# Patient Record
Sex: Female | Born: 1935 | Race: White | Hispanic: No | Smoking: Never smoker
Health system: Southern US, Community
[De-identification: ages and names within clinical notes are randomized; demographics above are authoritative.]

## PROBLEM LIST (undated history)

## (undated) DIAGNOSIS — E7800 Pure hypercholesterolemia, unspecified: Secondary | ICD-10-CM

## (undated) DIAGNOSIS — F329 Major depressive disorder, single episode, unspecified: Secondary | ICD-10-CM

## (undated) DIAGNOSIS — Z952 Presence of prosthetic heart valve: Secondary | ICD-10-CM

## (undated) DIAGNOSIS — F32A Depression, unspecified: Secondary | ICD-10-CM

## (undated) DIAGNOSIS — K219 Gastro-esophageal reflux disease without esophagitis: Secondary | ICD-10-CM

## (undated) DIAGNOSIS — I10 Essential (primary) hypertension: Secondary | ICD-10-CM

## (undated) DIAGNOSIS — I219 Acute myocardial infarction, unspecified: Secondary | ICD-10-CM

## (undated) HISTORY — PX: CARDIAC VALVE REPLACEMENT: SHX585

---

## 2004-06-25 ENCOUNTER — Ambulatory Visit: Payer: Self-pay | Admitting: Internal Medicine

## 2004-10-16 ENCOUNTER — Ambulatory Visit: Payer: Self-pay | Admitting: Gastroenterology

## 2004-10-26 ENCOUNTER — Ambulatory Visit: Payer: Self-pay | Admitting: Unknown Physician Specialty

## 2005-04-13 ENCOUNTER — Ambulatory Visit: Payer: Self-pay | Admitting: Gastroenterology

## 2005-07-08 ENCOUNTER — Ambulatory Visit: Payer: Self-pay | Admitting: Internal Medicine

## 2005-09-08 ENCOUNTER — Ambulatory Visit: Payer: Self-pay | Admitting: Internal Medicine

## 2005-09-19 ENCOUNTER — Ambulatory Visit: Payer: Self-pay | Admitting: Internal Medicine

## 2005-10-20 ENCOUNTER — Ambulatory Visit: Payer: Self-pay | Admitting: Internal Medicine

## 2006-12-29 ENCOUNTER — Ambulatory Visit: Payer: Self-pay | Admitting: Family Medicine

## 2007-06-07 ENCOUNTER — Ambulatory Visit: Payer: Self-pay | Admitting: Internal Medicine

## 2007-09-23 ENCOUNTER — Ambulatory Visit: Payer: Self-pay | Admitting: Family Medicine

## 2008-01-29 ENCOUNTER — Ambulatory Visit: Payer: Self-pay | Admitting: Family Medicine

## 2009-02-04 ENCOUNTER — Ambulatory Visit: Payer: Self-pay | Admitting: Nurse Practitioner

## 2009-04-08 ENCOUNTER — Encounter: Payer: Self-pay | Admitting: Nurse Practitioner

## 2009-05-04 ENCOUNTER — Ambulatory Visit: Payer: Self-pay | Admitting: Family Medicine

## 2010-04-16 ENCOUNTER — Ambulatory Visit: Payer: Self-pay | Admitting: Gastroenterology

## 2010-04-30 ENCOUNTER — Ambulatory Visit: Payer: Self-pay | Admitting: Family Medicine

## 2010-12-18 ENCOUNTER — Ambulatory Visit: Payer: Self-pay | Admitting: Internal Medicine

## 2010-12-28 ENCOUNTER — Ambulatory Visit: Payer: Self-pay | Admitting: Internal Medicine

## 2011-01-08 ENCOUNTER — Ambulatory Visit: Payer: Self-pay | Admitting: Internal Medicine

## 2011-04-21 ENCOUNTER — Encounter: Payer: Self-pay | Admitting: Internal Medicine

## 2011-04-23 ENCOUNTER — Encounter: Payer: Self-pay | Admitting: Internal Medicine

## 2011-05-21 ENCOUNTER — Encounter: Payer: Self-pay | Admitting: Internal Medicine

## 2011-06-21 ENCOUNTER — Encounter: Payer: Self-pay | Admitting: Internal Medicine

## 2011-07-21 ENCOUNTER — Encounter: Payer: Self-pay | Admitting: Internal Medicine

## 2011-09-29 ENCOUNTER — Ambulatory Visit: Payer: Self-pay | Admitting: Family Medicine

## 2012-03-01 ENCOUNTER — Observation Stay: Payer: Self-pay | Admitting: Internal Medicine

## 2012-03-01 LAB — BASIC METABOLIC PANEL
BUN: 16 mg/dL (ref 7–18)
Calcium, Total: 8.4 mg/dL — ABNORMAL LOW (ref 8.5–10.1)
Chloride: 108 mmol/L — ABNORMAL HIGH (ref 98–107)
Co2: 25 mmol/L (ref 21–32)
Creatinine: 0.62 mg/dL (ref 0.60–1.30)
EGFR (Non-African Amer.): >60
Glucose: 118 mg/dL — ABNORMAL HIGH (ref 65–99)
Osmolality: 282 (ref 275–301)
Potassium: 4.8 mmol/L (ref 3.5–5.1)
Sodium: 140 mmol/L (ref 136–145)

## 2012-03-01 LAB — CBC
HGB: 12.7 g/dL (ref 12.0–16.0)
MCH: 30.9 pg (ref 26.0–34.0)
MCHC: 32.8 g/dL (ref 32.0–36.0)
MCV: 94 fL (ref 80–100)
RBC: 4.11 10*6/uL (ref 3.80–5.20)

## 2012-03-02 LAB — BASIC METABOLIC PANEL
Anion Gap: 5 — ABNORMAL LOW (ref 7–16)
BUN: 17 mg/dL (ref 7–18)
Chloride: 107 mmol/L (ref 98–107)
Co2: 28 mmol/L (ref 21–32)
Creatinine: 0.79 mg/dL (ref 0.60–1.30)
EGFR (African American): >60
EGFR (Non-African Amer.): >60
Glucose: 124 mg/dL — ABNORMAL HIGH (ref 65–99)
Osmolality: 282 (ref 275–301)

## 2012-03-02 LAB — CBC WITH DIFFERENTIAL/PLATELET
Basophil #: 0 10*3/uL (ref 0.0–0.1)
Eosinophil %: 1 %
HCT: 32.7 % — ABNORMAL LOW (ref 35.0–47.0)
HGB: 10.7 g/dL — ABNORMAL LOW (ref 12.0–16.0)
Lymphocyte #: 1 10*3/uL (ref 1.0–3.6)
Lymphocyte %: 11.2 %
MCH: 30.7 pg (ref 26.0–34.0)
MCV: 94 fL (ref 80–100)
Monocyte %: 10.3 %
Neutrophil #: 7.2 10*3/uL — ABNORMAL HIGH (ref 1.4–6.5)
Platelet: 174 10*3/uL (ref 150–440)
RBC: 3.5 10*6/uL — ABNORMAL LOW (ref 3.80–5.20)
RDW: 14 % (ref 11.5–14.5)
WBC: 9.4 10*3/uL (ref 3.6–11.0)

## 2012-03-03 ENCOUNTER — Encounter: Payer: Self-pay | Admitting: Internal Medicine

## 2012-03-03 LAB — CBC WITH DIFFERENTIAL/PLATELET
Basophil %: 0.5 %
Eosinophil %: 1.9 %
HCT: 33.3 % — ABNORMAL LOW (ref 35.0–47.0)
HGB: 11.8 g/dL — ABNORMAL LOW (ref 12.0–16.0)
Lymphocyte #: 1.1 10*3/uL (ref 1.0–3.6)
Lymphocyte %: 9 %
MCHC: 35.5 g/dL (ref 32.0–36.0)
MCV: 94 fL (ref 80–100)
Monocyte %: 9.7 %
Neutrophil #: 9.2 10*3/uL — ABNORMAL HIGH (ref 1.4–6.5)
WBC: 11.7 10*3/uL — ABNORMAL HIGH (ref 3.6–11.0)

## 2012-03-03 LAB — BASIC METABOLIC PANEL
BUN: 14 mg/dL (ref 7–18)
Calcium, Total: 8.6 mg/dL (ref 8.5–10.1)
Creatinine: 0.77 mg/dL (ref 0.60–1.30)
EGFR (African American): >60
EGFR (Non-African Amer.): >60
Glucose: 124 mg/dL — ABNORMAL HIGH (ref 65–99)
Osmolality: 279 (ref 275–301)
Potassium: 4.1 mmol/L (ref 3.5–5.1)
Sodium: 139 mmol/L (ref 136–145)

## 2012-03-22 ENCOUNTER — Encounter: Payer: Self-pay | Admitting: Internal Medicine

## 2012-04-03 ENCOUNTER — Ambulatory Visit: Payer: Self-pay | Admitting: Orthopedic Surgery

## 2012-04-06 LAB — BASIC METABOLIC PANEL
Anion Gap: 4 — ABNORMAL LOW (ref 7–16)
BUN: 16 mg/dL (ref 7–18)
Co2: 29 mmol/L (ref 21–32)
Creatinine: 0.7 mg/dL (ref 0.60–1.30)
EGFR (Non-African Amer.): >60
Potassium: 4.3 mmol/L (ref 3.5–5.1)
Sodium: 140 mmol/L (ref 136–145)

## 2012-05-10 ENCOUNTER — Ambulatory Visit: Payer: Self-pay | Admitting: Family Medicine

## 2013-11-19 IMAGING — CR DG KNEE COMPLETE 4+V*L*
1 series · 4 of 4 positions shown · non-contrast
Comparison: none

REASON FOR EXAM: left knee pain, injury
COMMENTS:

PROCEDURE:     DXR - DXR KNEE LT COMP WITH OBLIQUES  - March 01, 2012  [DATE]
RESULT:     Degenerative change. No acute bony or joint abnormality.

[Series 1: ap · 0.17mm/px · 4 of 4 slices shown]
[im 1/4]
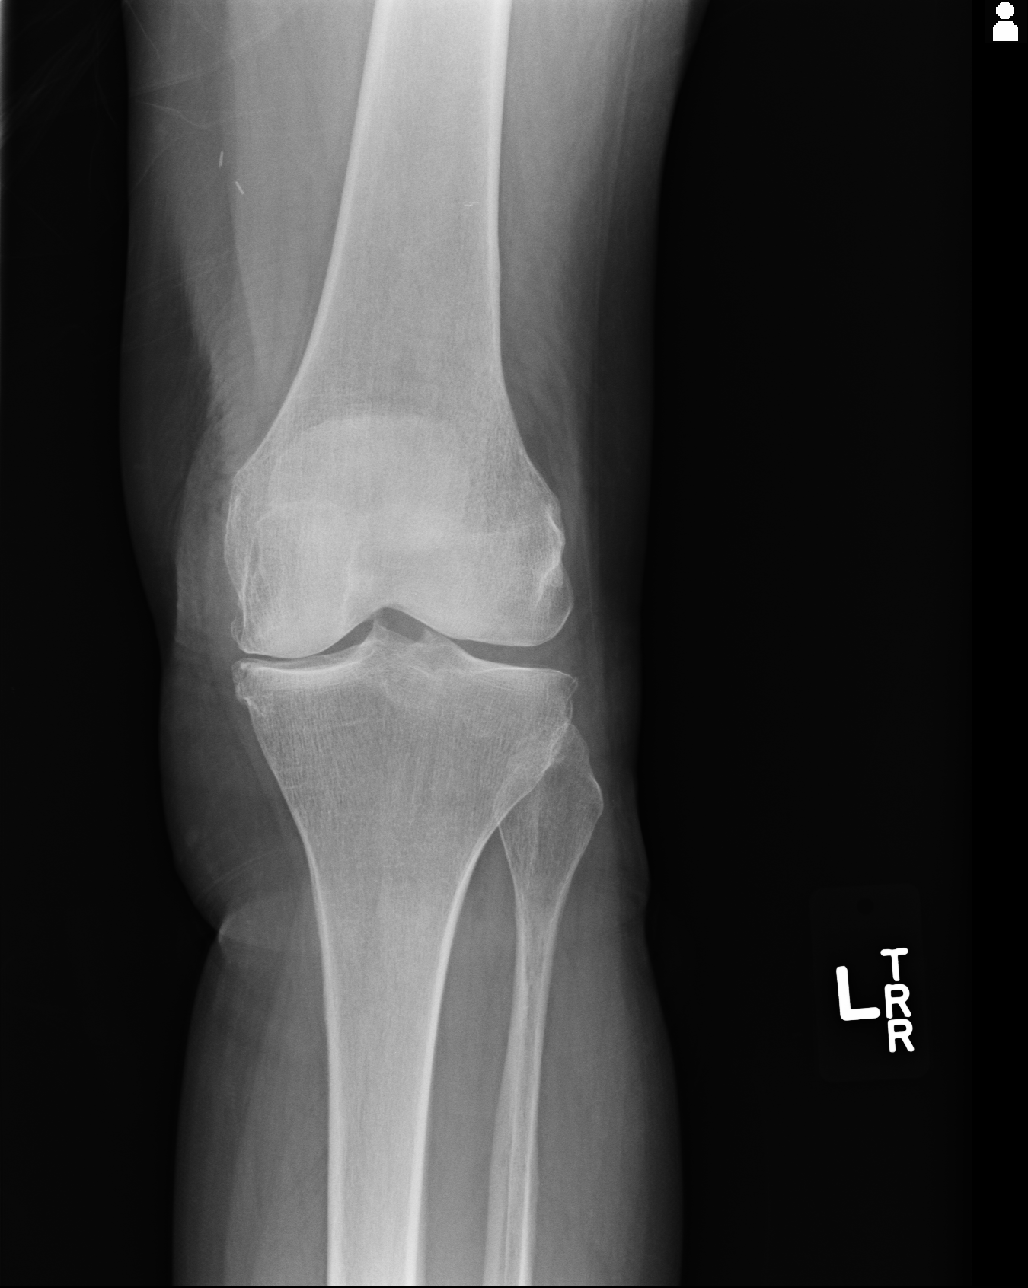
[im 2/4]
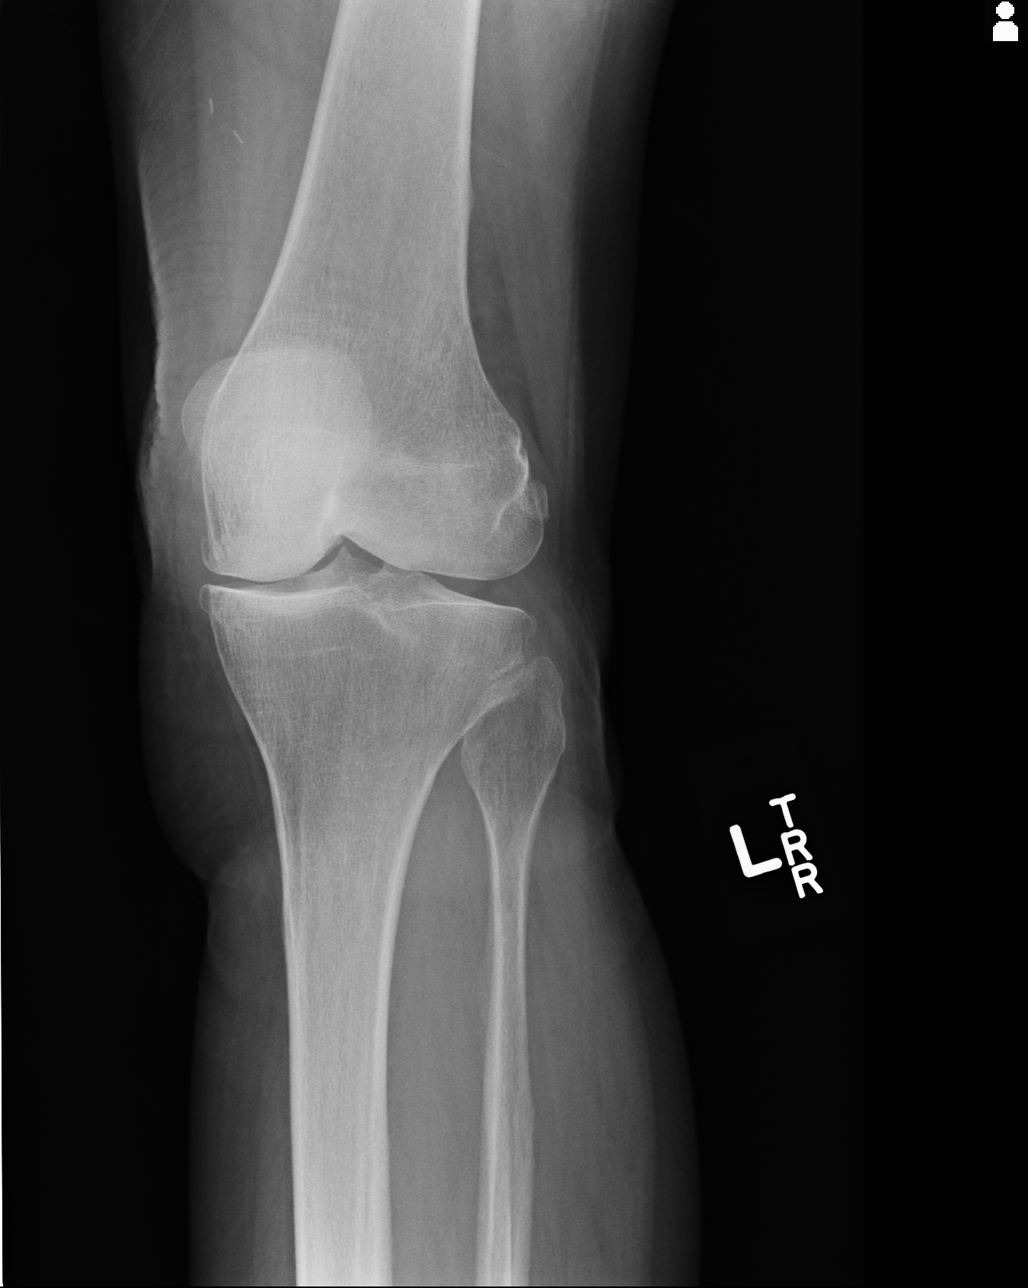
[im 3/4]
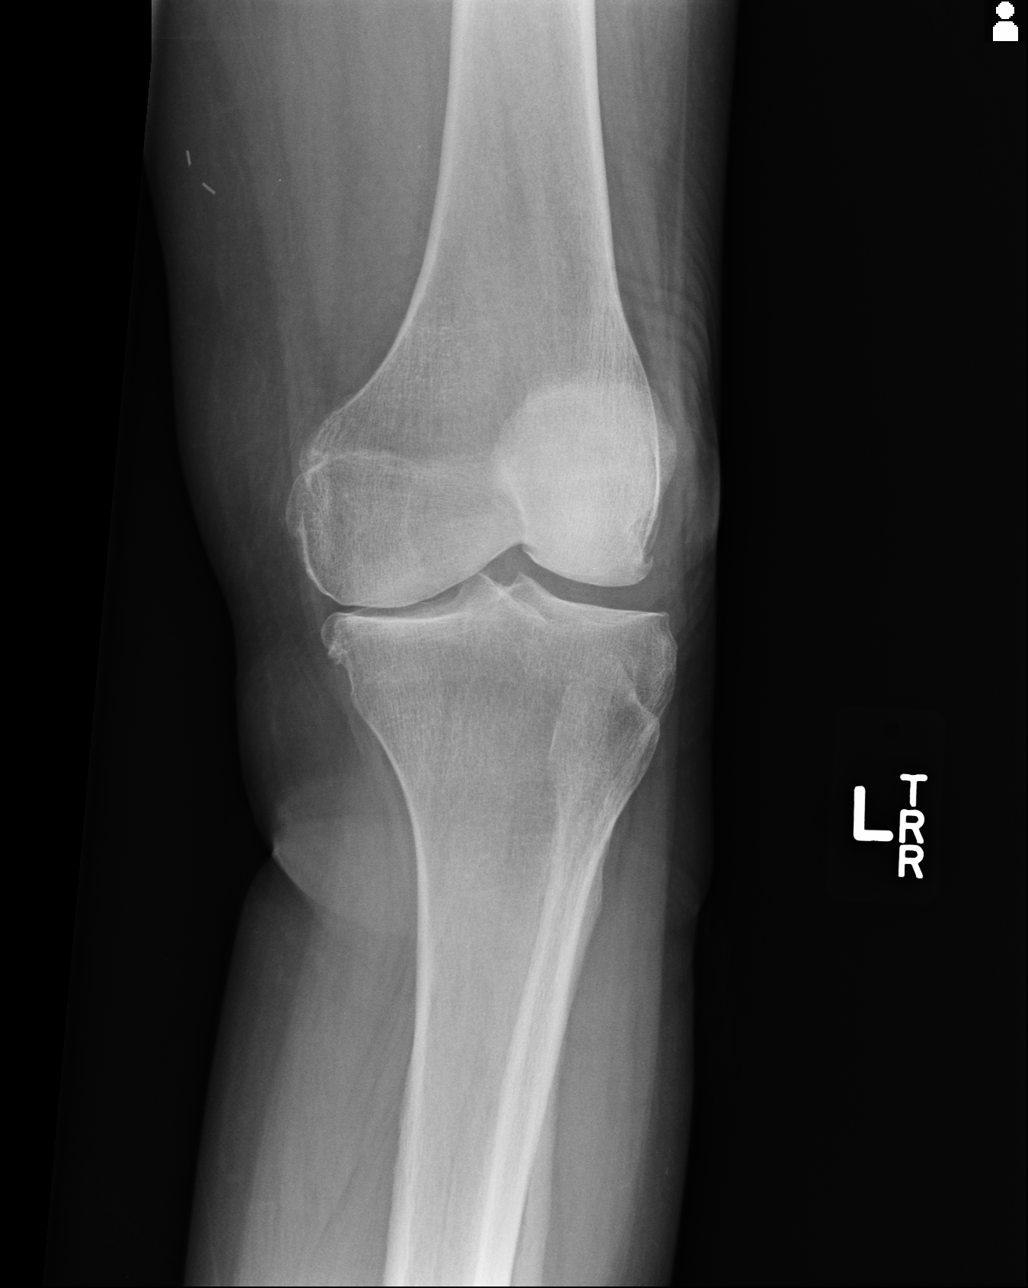
[im 4/4]
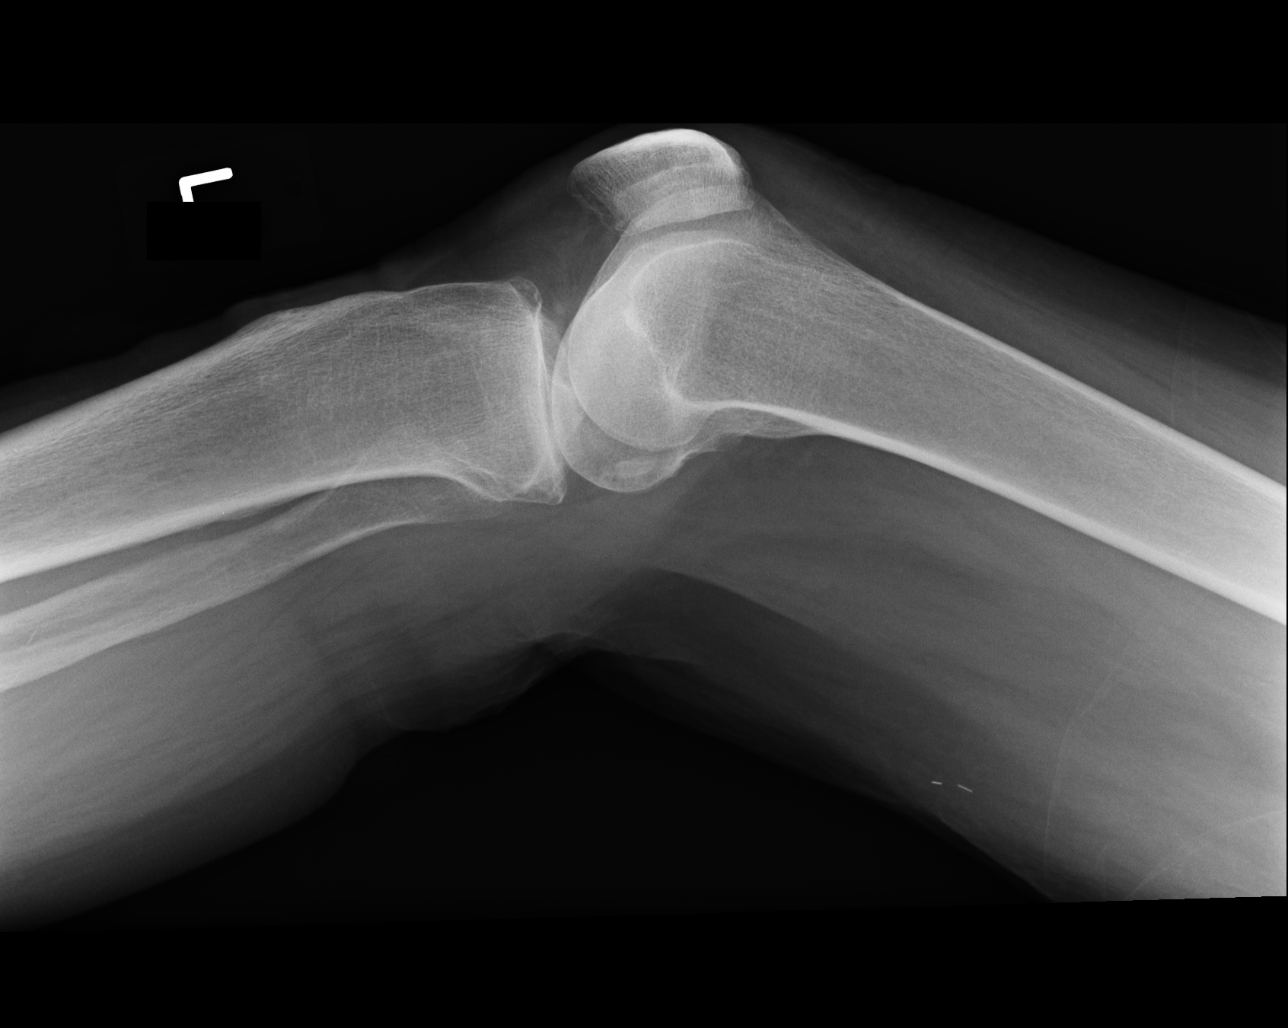

[4 of 4 positions shown; findings below may reference images not displayed]

IMPRESSION: Degenerative change. No acute bony or joint abnormality
noted. Incidental note is made of surgical clips over the medial thigh.

## 2013-12-07 DIAGNOSIS — E785 Hyperlipidemia, unspecified: Secondary | ICD-10-CM | POA: Insufficient documentation

## 2013-12-07 DIAGNOSIS — I2510 Atherosclerotic heart disease of native coronary artery without angina pectoris: Secondary | ICD-10-CM | POA: Insufficient documentation

## 2013-12-07 DIAGNOSIS — I10 Essential (primary) hypertension: Secondary | ICD-10-CM | POA: Insufficient documentation

## 2013-12-07 DIAGNOSIS — R059 Cough, unspecified: Secondary | ICD-10-CM | POA: Insufficient documentation

## 2013-12-08 DIAGNOSIS — I5023 Acute on chronic systolic (congestive) heart failure: Secondary | ICD-10-CM | POA: Insufficient documentation

## 2014-07-09 NOTE — H&P (Signed)
PATIENT NAME:  Tina Mclaughlin, TRAMPE MR#:  [id number] DATE OF BIRTH:  [birth year]  DATE OF ADMISSION:  03/01/2012  PRIMARY CARE PHYSICIAN: Dr. Quillian Quince    REFERRING PHYSICIAN: Dr. Darnelle Catalan   CHIEF COMPLAINT: Fall by accident today.   HISTORY OF PRESENT ILLNESS: The patient is a 81 year old Caucasian female with a history of CAD, hypertension, and hyperlipidemia who presented to the ED with fall by accident today. The patient is alert, awake, oriented in no acute distress. She said she fell by accident at home today and has pelvic pain and left knee pain. She injured her left knee with some bruises but she denies any loss of consciousness. No syncope, seizure, or incontinence. No slurred speech or dysphagia. The patient is living alone. Dr. Darnelle Catalan and I discussed with the case manager. Case manager recommend the patient needed to be placed for observation, get a physical therapy evaluation, and pain control.   PAST MEDICAL HISTORY: As mentioned above:  1. CAD.  2. Hypertension.  3. Hyperlipidemia.   PAST SURGICAL HISTORY:  1. CABG.  2. Shoulder surgery.   SOCIAL HISTORY: The patient is living alone. No smoking, alcohol drinking, or illicit drugs.   FAMILY HISTORY: CAD, congestive heart failure.   ALLERGIES: Fosamax.   HOME MEDICATIONS:  1. Zocor 20 mg p.o. daily.  2. Promethazine 25 mg every six hours.  3. Lopressor 25 mg p.o. at bedtime.  4. Diclofenac potassium 50 mg p.o. powder for reconstitution.  5. Aspirin 81 mg p.o. daily.  6. Alendronate 70 mg p.o. once a week.   REVIEW OF SYSTEMS: CONSTITUTIONAL: The patient denies any fever or chills. No headache or dizziness. No weakness. EYES: No double vision or blurred vision. ENT: No postnasal drip, slurred speech, or dysphagia. CARDIOVASCULAR: No chest pain, palpitation, orthopnea, or nocturnal dyspnea. No leg edema. PULMONARY: No cough, sputum, shortness of breath, or hemoptysis. GI: No abdominal pain, nausea, vomiting, or diarrhea. No  melena or bloody stool. GU: No dysuria, hematuria, or incontinence. SKIN: No rash or jaundice. NEUROLOGY: No syncope, loss of consciousness, or seizure. MUSCULOSKELETAL: Pelvic pain and left knee pain.   PHYSICAL EXAMINATION:   VITAL SIGNS: Temperature 98.2, blood pressure 169/78, pulse 66, oxygen saturation 98% on room air.   GENERAL: The patient is alert, awake, oriented in no acute distress.   HEENT: Pupils round, equal, reactive to light and accommodation. Dry oral mucosa. Clear oropharynx.   NECK: Supple. No JVD or carotid bruit. No lymphadenopathy. No thyromegaly.   CARDIOVASCULAR: S1, S2 regular rate and rhythm. No murmurs or gallops.   PULMONARY: Bilateral air entry. No wheezing or rales. No use of accessory muscles to breathe.   ABDOMEN: Soft. No distention. No tenderness. No organomegaly. Bowel sounds present.   EXTREMITIES: No edema, clubbing, or cyanosis. No calf tenderness. Strong bilateral pedal pulses. Bruises and skin tear on the left knee. The patient cannot walk or move his left leg due to pain.   NEUROLOGY: Alert and oriented x3. No focal deficit.   LABORATORY DATA: No laboratory data.   RADIOLOGY: Chest x-ray showed pelvic fracture.   IMPRESSION:  1. Pelvic fracture due to fall.  2. CAD.  3. Hypertension.  4. Hyperlipidemia.   PLAN OF TREATMENT:  1. The patient will be placed for observation.  2. We will have pain control with Percocet and morphine p.r.n.  3. Will get a PT evaluation.  4. Will continue aspirin and Lopressor.  5. GI and DVT prophylaxis.   Discussed the patient's situation and  plan of treatment with the patient and the case manager.   TIME SPENT: About 50 minutes.   ____________________________ Shaune PollackQing Mariany Mackintosh, MD qc:drc D: 03/01/2012 20:05:59 ET T: 03/02/2012 06:04:34 ET JOB#: [id number]  cc: Shaune PollackQing Burak Zerbe, MD, <Dictator> Burley SaverL. Katherine Bliss, MD Shaune PollackQING Breydan Shillingburg MD ELECTRONICALLY SIGNED 03/04/2012 12:39

## 2014-07-09 NOTE — Consult Note (Signed)
Brief Consult Note: Diagnosis: Left superior and inferior rami pelvis fractures.   Patient was seen by consultant.   Recommend further assessment or treatment.   Comments: Patient is a 81 year old female who sustained a fall at home.  Following the injury she had pain in the anterior pelvis/groin area.  Patient was admitted to the medical service for pain management and placement.  Today patient is sitting in a chair.  She states that movement of the left hip causes significant pain.  Patient was not having pain sitting still in the chair.  Patient denies LOC or other injuries.  Her PMHx is reviewed from her admission note.  On exam, her skin is intact.  She has pain with passive movement of her right hip.  She has a superficial abrasion over the anterior right knee.  She is neurovascularly intact with intact sensation to light touch and palpable pedal pulses in both lower extremities.  Patient has intact motor function in both lower extremities as well.  Radiographs of the pelvis and hip demonstrate minimally displaced fractures of the left superior and inferior rami and no evidence of hip fracture.  There is no evidence of fracture about the knee.  I recommend pain management with IV and oral analgesics as needed and physical therapy for WBAT on the left lower extremity.  I recommend placement for the patient in rehab after discharge.  Follow up in my office in 4-6 weeks for reevaluation.  Electronic Signatures: Juanell FairlyKrasinski, Maxyne Derocher (MD)  (Signed 12-Dec-13 15:17)  Authored: Brief Consult Note   Last Updated: 12-Dec-13 15:17 by Juanell FairlyKrasinski, Jonanthony Nahar (MD)

## 2014-07-09 NOTE — Discharge Summary (Signed)
PATIENT NAME:  Tina Mclaughlin, Tina Mclaughlin MR#:  [id number] DATE OF BIRTH:  [birth year]  DATE OF ADMISSION:  03/01/2012 DATE OF DISCHARGE:  03/03/2012  DISCHARGE DIAGNOSES:  1. Pubic rami fracture.   2. Hypertension.    CHIEF COMPLAINT: Fall by accident.   PRESENTATION: 81 year old Caucasian female with history of coronary artery disease, hypertension and hyperlipidemia presented to Emergency Room after fall by accident. She was fully alert, oriented, no acute distress at the time of presentation and after the fall she had pelvic pain and left knee pain. She injured her left knee with some bruises but she denied any loss of consciousness at the time of fall, denied any syncopal episodes, seizures, or incontinence. No slurred speech or focal weakness or dysphagia. She was living alone and independently before the fall.    HOSPITAL COURSE AND STAY: Because of pelvic pain her pelvic x-ray was done in ER and show fracture of superior and inferior left pubic rami fracture slightly displaced. Hip x-ray left side shows slightly displaced fracture of superior and inferior rami of left side and left knee x-ray was also done due to pain on the left knee degenerative changes, no acute bony or joint abnormality noted. Surgical clips over the medial thigh. She was admitted for observation on medical floor an orthopedic consult was done. Orthopedic surgeon agreed with the medical management and rehab placement for her for physical therapy and follow up after 4 to 6 weeks. They suggested pain management as patient can tolerate. Patient's pain was managed in hospital with IV morphine injections and oral oxycodone as needed basis which she needed almost every 4 to 6 hours. Physical therapy consult was done to evaluate and decide further planning for physical therapy and rehabilitation placement and that was arranged later on and so she is being discharged with pain management to be continued with long-acting OxyContin and oxycodone  p.r.n. basis. Other medical issues addressed during this hospital stay: Coronary artery disease status post coronary artery bypass graft in 2003. We continued aspirin, metoprolol and statin, valve replacement surgery more than one year ago that was bovine valve being placed and so does not need any anticoagulation. Hypertension, controlled with metoprolol. Deep vein thrombosis prophylaxis provided with heparin subcutaneously. As she has pelvic pain due to fracture and will be having limited mobility for next few days till she recovers enough to get on to her physical therapy properly and becomes active we recommend to continue heparin sub-Q every eight hours for deep vein thrombosis prophylaxis for now and reassessment every few days depending on her functional status.   LABORATORY, DIAGNOSTIC AND RADIOLOGICAL DATA: Important lab results during the hospital stay: CBC on discharge: WBC 11.7, RBC 3.55, hemoglobin 11.8, hematocrit 33.3, platelet count 155, MCV 94, glucose 124, BUN 14, creatinine 0.77, sodium 139, potassium 4.1, chloride 105, CO2 27   CONDITION ON DISCHARGE: Satisfactory.   CODE STATUS ON DISCHARGE: FULL CODE.    MEDICATIONS TO BE TAKEN AFTER DISCHARGE:  1. Simvastatin 20 mg oral tablet at bedtime.  2. Aspirin 81 mg once a day. 3. Xanax 0.5 mg oral once a day. 4. Paxil 10 mg oral tablet once a day. 5. Metoprolol succinate 25 mg tablet extended-release once a day. 6. Oxycodone 15 mg oral tablet extended-release orally every 12 hours. 7. Acetaminophen plus oxycodone 325 mg plus 5 mg oral tablet 1 tablet orally 4 times a day as needed for pain. 8. Heparin 5000 units subcutaneously every eight hours.   REFERRAL: Physical therapy of  fractured  pelvis.   DIET ON DISCHARGE: Low sodium. Consistency regular.   ACTIVITY LIMITATION AFTER DISCHARGE: As tolerated by physical therapy.  TIMEFRAME TO FOLLOW UP: Within 4 to 6 week. Follow up with orthopedic clinic, Dr. Juanell FairlyKevin Krasinski, [facility], [place].  TOTAL TIME SPENT IN DISCHARGE: 45 minutes. ____________________________ Hope PigeonVaibhavkumar G. Elisabeth PigeonVachhani, MD vgv:cms D: 03/03/2012 12:38:05 ET T: 03/03/2012 12:59:27 ET  JOB#: [id number] cc: Kathreen DevoidKevin L. Krasinski, MD Altamese DillingVAIBHAVKUMAR Thurmon Mizell MD ELECTRONICALLY SIGNED 03/06/2012 23:00

## 2014-08-24 ENCOUNTER — Ambulatory Visit: Payer: Medicare Other

## 2014-08-24 ENCOUNTER — Ambulatory Visit
Admission: EM | Admit: 2014-08-24 | Discharge: 2014-08-24 | Disposition: A | Discharge status: Home / Self Care | Payer: Medicare Other | Attending: Family Medicine | Admitting: Family Medicine

## 2014-08-24 DIAGNOSIS — W228XXA Striking against or struck by other objects, initial encounter: Secondary | ICD-10-CM | POA: Insufficient documentation

## 2014-08-24 DIAGNOSIS — T148 Other injury of unspecified body region: Secondary | ICD-10-CM | POA: Diagnosis not present

## 2014-08-24 DIAGNOSIS — Z7982 Long term (current) use of aspirin: Secondary | ICD-10-CM | POA: Insufficient documentation

## 2014-08-24 DIAGNOSIS — M79672 Pain in left foot: Secondary | ICD-10-CM | POA: Diagnosis present

## 2014-08-24 DIAGNOSIS — T148XXA Other injury of unspecified body region, initial encounter: Secondary | ICD-10-CM

## 2014-08-24 DIAGNOSIS — L03116 Cellulitis of left lower limb: Secondary | ICD-10-CM

## 2014-08-24 DIAGNOSIS — S8012XA Contusion of left lower leg, initial encounter: Secondary | ICD-10-CM | POA: Diagnosis not present

## 2014-08-24 DIAGNOSIS — Z79899 Other long term (current) drug therapy: Secondary | ICD-10-CM | POA: Insufficient documentation

## 2014-08-24 DIAGNOSIS — I10 Essential (primary) hypertension: Secondary | ICD-10-CM | POA: Diagnosis not present

## 2014-08-24 DIAGNOSIS — E780 Pure hypercholesterolemia: Secondary | ICD-10-CM | POA: Diagnosis not present

## 2014-08-24 DIAGNOSIS — I252 Old myocardial infarction: Secondary | ICD-10-CM | POA: Insufficient documentation

## 2014-08-24 HISTORY — DX: Acute myocardial infarction, unspecified: I21.9

## 2014-08-24 HISTORY — DX: Pure hypercholesterolemia, unspecified: E78.00

## 2014-08-24 HISTORY — DX: Essential (primary) hypertension: I10

## 2014-08-24 MED ORDER — MUPIROCIN 2 % EX OINT: 1.0000 "application " | TOPICAL_OINTMENT | Freq: Three times a day (TID) | CUTANEOUS | Status: DC

## 2014-08-24 MED ORDER — CEFUROXIME AXETIL 500 MG PO TABS: 500.0000 mg | ORAL_TABLET | Freq: Two times a day (BID) | ORAL | Status: DC

## 2014-08-24 NOTE — Discharge Instructions (Signed)
Cellulitis °Cellulitis is an infection of the skin and the tissue under the skin. The infected area is usually red and tender. This happens most often in the arms and lower legs. °HOME CARE  °· Take your antibiotic medicine as told. Finish the medicine even if you start to feel better. °· Keep the infected arm or leg raised (elevated). °· Put a warm cloth on the area up to 4 times per day. °· Only take medicines as told by your doctor. °· Keep all doctor visits as told. °GET HELP IF: °· You see red streaks on the skin coming from the infected area. °· Your red area gets bigger or turns a dark color. °· Your bone or joint under the infected area is painful after the skin heals. °· Your infection comes back in the same area or different area. °· You have a puffy (swollen) bump in the infected area. °· You have new symptoms. °· You have a fever. °GET HELP RIGHT AWAY IF:  °· You feel very sleepy. °· You throw up (vomit) or have watery poop (diarrhea). °· You feel sick and have muscle aches and pains. °MAKE SURE YOU:  °· Understand these instructions. °· Will watch your condition. °· Will get help right away if you are not doing well or get worse. °Document Released: 08/25/2007 Document Revised: 07/23/2013 Document Reviewed: 05/24/2011 °ExitCare® Patient Information ©2015 [employer]. This information is not intended to replace advice given to you by your health care provider. Make sure you discuss any questions you have with your health care provider. ° °Contusion °A contusion is a deep bruise. Contusions happen when an injury causes bleeding under the skin. Signs of bruising include pain, puffiness (swelling), and discolored skin. The contusion may turn blue, purple, or yellow. °HOME CARE  °· Put ice on the injured area. °¨ Put ice in a plastic bag. °¨ Place a towel between your skin and the bag. °¨ Leave the ice on for 15-20 minutes, 03-04 times a day. °· Only take medicine as told by your doctor. °· Rest the  injured area. °· If possible, raise (elevate) the injured area to lessen puffiness. °GET HELP RIGHT AWAY IF:  °· You have more bruising or puffiness. °· You have pain that is getting worse. °· Your puffiness or pain is not helped by medicine. °MAKE SURE YOU:  °· Understand these instructions. °· Will watch your condition. °· Will get help right away if you are not doing well or get worse. °Document Released: 08/25/2007 Document Revised: 05/31/2011 Document Reviewed: 01/11/2011 °ExitCare® Patient Information ©2015 [employer]. This information is not intended to replace advice given to you by your health care provider. Make sure you discuss any questions you have with your health care provider. ° °

## 2014-08-24 NOTE — ED Provider Notes (Signed)
CSN: [id number]     Arrival date & time 08/24/14  1026 History   First MD Initiated Contact with Patient 08/24/14 1130     Chief Complaint  Patient presents with  . Foot Pain   (Consider location/radiation/quality/duration/timing/severity/associated sxs/prior Treatment) Patient is a 81 y.o. female presenting with lower extremity pain. The history is provided by the patient. No language interpreter was used.  Foot Pain This is a new problem. The current episode started more than 2 days ago. The problem has been gradually worsening. Pertinent negatives include no chest pain, no abdominal pain and no shortness of breath. Nothing aggravates the symptoms. Nothing relieves the symptoms.    Past Medical History  Diagnosis Date  . Hypertension   . Hypercholesteremia   . Myocardial infarct    Past Surgical History  Procedure Laterality Date  . Cardiac valve replacement     History reviewed. No pertinent family history. History  Substance Use Topics  . Smoking status: Never Smoker   . Smokeless tobacco: Not on file  . Alcohol Use: No   OB History    No data available     Review of Systems  Respiratory: Negative for shortness of breath.   Cardiovascular: Negative for chest pain.  Gastrointestinal: Negative for abdominal pain.  All other systems reviewed and are negative.   Allergies  Review of patient's allergies indicates no known allergies.  Home Medications   Prior to Admission medications   Medication Sig Start Date End Date Taking? Authorizing Provider  aspirin 81 MG tablet Take 81 mg by mouth daily.   Yes Historical Provider, MD  furosemide (LASIX) 10 MG/ML solution Take by mouth daily.   Yes Historical Provider, MD  lisinopril (PRINIVIL,ZESTRIL) 10 MG tablet Take 10 mg by mouth daily.   Yes Historical Provider, MD  potassium chloride (K-DUR,KLOR-CON) 10 MEQ tablet Take 10 mEq by mouth 2 (two) times daily.   Yes Historical Provider, MD  pravastatin (PRAVACHOL) 10 MG  tablet Take 10 mg by mouth daily.   Yes Historical Provider, MD  sertraline (ZOLOFT) 25 MG tablet Take 25 mg by mouth daily.   Yes Historical Provider, MD   BP 119/59 mmHg  Pulse 72  Temp(Src) 97.1 F (36.2 C) (Tympanic)  Resp 16  Ht 5\' 4"  (1.626 m)  Wt 131 lb (59.421 kg)  BMI 22.47 kg/m2  SpO2 98% Physical Exam  Constitutional: She is oriented to person, place, and time. She appears well-developed and well-nourished.  HENT:  Head: Normocephalic.  Musculoskeletal:       Left ankle: She exhibits swelling, ecchymosis and laceration. Tenderness.       Feet:  Neurological: She is alert and oriented to person, place, and time.  Skin: Skin is warm.  Psychiatric: Thought content normal.    ED Course  Procedures (including critical care time) Labs Review Labs Reviewed - No data to display  Imaging Review Dg Foot Complete Left  08/24/2014   CLINICAL DATA:  Trauma. The patient dropped a wire shelf on her foot 2 days ago. Pain and swelling.  EXAM: LEFT FOOT - COMPLETE 3+ VIEW  COMPARISON:  None.  FINDINGS: Soft tissue swelling is noted over the distal foot. No acute fracture is evident. There is no radiopaque foreign body. The ankle is intact. A small plantar calcaneal spur is noted.  IMPRESSION: Soft tissue swelling over the dorsum of foot without an acute fracture.   Electronically Signed   By: Marin Robertshristopher  Mattern M.D.   On: 08/24/2014 11:47  Pending x-rays and no signs of fracture will treat the foot with Bactroban ointment apply directly 3 times a day and Ceftin 500 mg twice a day for the cellulitis. Follow-up with PCP if not better in a week.  MDM   1. Cellulitis of left lower extremity   2. Contusion        Hassan Rowan, MD 08/24/14 1230

## 2014-08-24 NOTE — ED Notes (Signed)
States a clothes rack dropped on left foot Wednesday then flipped and hit same spot. 9/10 pain with redness and swelling

## 2014-12-04 ENCOUNTER — Observation Stay
Admission: EM | Admit: 2014-12-04 | Discharge: 2014-12-05 | Disposition: A | Discharge status: Home / Self Care | Payer: Medicare Other | Attending: Internal Medicine | Admitting: Internal Medicine

## 2014-12-04 ENCOUNTER — Emergency Department: Payer: Medicare Other

## 2014-12-04 DIAGNOSIS — R0789 Other chest pain: Principal | ICD-10-CM | POA: Insufficient documentation

## 2014-12-04 DIAGNOSIS — I2510 Atherosclerotic heart disease of native coronary artery without angina pectoris: Secondary | ICD-10-CM | POA: Insufficient documentation

## 2014-12-04 DIAGNOSIS — I252 Old myocardial infarction: Secondary | ICD-10-CM | POA: Insufficient documentation

## 2014-12-04 DIAGNOSIS — R079 Chest pain, unspecified: Secondary | ICD-10-CM | POA: Diagnosis present

## 2014-12-04 DIAGNOSIS — I10 Essential (primary) hypertension: Secondary | ICD-10-CM | POA: Insufficient documentation

## 2014-12-04 DIAGNOSIS — F329 Major depressive disorder, single episode, unspecified: Secondary | ICD-10-CM | POA: Diagnosis not present

## 2014-12-04 DIAGNOSIS — E780 Pure hypercholesterolemia: Secondary | ICD-10-CM | POA: Insufficient documentation

## 2014-12-04 DIAGNOSIS — Z888 Allergy status to other drugs, medicaments and biological substances status: Secondary | ICD-10-CM | POA: Diagnosis not present

## 2014-12-04 DIAGNOSIS — Z7982 Long term (current) use of aspirin: Secondary | ICD-10-CM | POA: Insufficient documentation

## 2014-12-04 DIAGNOSIS — K219 Gastro-esophageal reflux disease without esophagitis: Secondary | ICD-10-CM | POA: Diagnosis not present

## 2014-12-04 DIAGNOSIS — Z952 Presence of prosthetic heart valve: Secondary | ICD-10-CM | POA: Diagnosis not present

## 2014-12-04 DIAGNOSIS — Z951 Presence of aortocoronary bypass graft: Secondary | ICD-10-CM | POA: Insufficient documentation

## 2014-12-04 DIAGNOSIS — Z79899 Other long term (current) drug therapy: Secondary | ICD-10-CM | POA: Diagnosis not present

## 2014-12-04 HISTORY — DX: Depression, unspecified: F32.A

## 2014-12-04 HISTORY — DX: Major depressive disorder, single episode, unspecified: F32.9

## 2014-12-04 HISTORY — DX: Presence of prosthetic heart valve: Z95.2

## 2014-12-04 HISTORY — DX: Gastro-esophageal reflux disease without esophagitis: K21.9

## 2014-12-04 LAB — CBC
HEMATOCRIT: 38.3 % (ref 35.0–47.0)
HEMOGLOBIN: 12.8 g/dL (ref 12.0–16.0)
MCH: 31.3 pg (ref 26.0–34.0)
MCHC: 33.5 g/dL (ref 32.0–36.0)
MCV: 93.5 fL (ref 80.0–100.0)
Platelets: 207 10*3/uL (ref 150–440)
RBC: 4.09 MIL/uL (ref 3.80–5.20)
RDW: 12.8 % (ref 11.5–14.5)
WBC: 8.2 10*3/uL (ref 3.6–11.0)

## 2014-12-04 LAB — BASIC METABOLIC PANEL
Anion gap: 6 (ref 5–15)
BUN: 16 mg/dL (ref 6–20)
CALCIUM: 9 mg/dL (ref 8.9–10.3)
CO2: 27 mmol/L (ref 22–32)
Chloride: 106 mmol/L (ref 101–111)
Creatinine, Ser: 0.66 mg/dL (ref 0.44–1.00)
GFR calc Af Amer: >60 mL/min (ref >60)
GFR calc non Af Amer: >60 mL/min (ref >60)
GLUCOSE: 98 mg/dL (ref 65–99)
POTASSIUM: 4.4 mmol/L (ref 3.5–5.1)
Sodium: 139 mmol/L (ref 135–145)

## 2014-12-04 LAB — TROPONIN I
Troponin I: <0.03 ng/mL (ref <0.031)
Troponin I: <0.03 ng/mL (ref <0.031)
Troponin I: <0.03 ng/mL (ref <0.031)

## 2014-12-04 MED ORDER — POTASSIUM CHLORIDE CRYS ER 10 MEQ PO TBCR
10.0000 meq | EXTENDED_RELEASE_TABLET | Freq: Two times a day (BID) | ORAL | Status: DC
  Administered 2014-12-04 – 2014-12-05 (×2): 10 meq via ORAL
  Filled 2014-12-04 (×2): qty 1

## 2014-12-04 MED ORDER — ASPIRIN 81 MG PO CHEW
324.0000 mg | CHEWABLE_TABLET | Freq: Once | ORAL | Status: AC
  Administered 2014-12-04: 324 mg via ORAL
  Filled 2014-12-04: qty 4

## 2014-12-04 MED ORDER — ACETAMINOPHEN 650 MG RE SUPP: 650.0000 mg | Freq: Four times a day (QID) | RECTAL | Status: DC | PRN

## 2014-12-04 MED ORDER — ASPIRIN 81 MG PO TABS: 81.0000 mg | ORAL_TABLET | Freq: Every day | ORAL | Status: DC

## 2014-12-04 MED ORDER — ASPIRIN 81 MG PO CHEW
81.0000 mg | CHEWABLE_TABLET | Freq: Every day | ORAL | Status: DC
  Administered 2014-12-05: 81 mg via ORAL
  Filled 2014-12-04: qty 1

## 2014-12-04 MED ORDER — PRAVASTATIN SODIUM 10 MG PO TABS: 10.0000 mg | ORAL_TABLET | Freq: Every day | ORAL | Status: DC

## 2014-12-04 MED ORDER — MONTELUKAST SODIUM 10 MG PO TABS
10.0000 mg | ORAL_TABLET | Freq: Every day | ORAL | Status: DC
  Administered 2014-12-04: 10 mg via ORAL
  Filled 2014-12-04: qty 1

## 2014-12-04 MED ORDER — ZOLPIDEM TARTRATE 5 MG PO TABS
5.0000 mg | ORAL_TABLET | Freq: Every evening | ORAL | Status: DC | PRN
  Administered 2014-12-04: 5 mg via ORAL
  Filled 2014-12-04: qty 1

## 2014-12-04 MED ORDER — HYDROXYZINE HCL 25 MG PO TABS
25.0000 mg | ORAL_TABLET | ORAL | Status: DC | PRN
  Filled 2014-12-04 (×2): qty 1

## 2014-12-04 MED ORDER — ONDANSETRON HCL 4 MG PO TABS: 4.0000 mg | ORAL_TABLET | Freq: Four times a day (QID) | ORAL | Status: DC | PRN

## 2014-12-04 MED ORDER — MORPHINE SULFATE (PF) 2 MG/ML IV SOLN: 2.0000 mg | INTRAVENOUS | Status: DC | PRN

## 2014-12-04 MED ORDER — FUROSEMIDE 10 MG/ML PO SOLN
10.0000 mg | Freq: Every day | ORAL | Status: DC
  Filled 2014-12-04: qty 1

## 2014-12-04 MED ORDER — SERTRALINE HCL 25 MG PO TABS
25.0000 mg | ORAL_TABLET | Freq: Every day | ORAL | Status: DC
  Administered 2014-12-05: 25 mg via ORAL
  Filled 2014-12-04: qty 1

## 2014-12-04 MED ORDER — ONDANSETRON HCL 4 MG/2ML IJ SOLN: 4.0000 mg | Freq: Four times a day (QID) | INTRAMUSCULAR | Status: DC | PRN

## 2014-12-04 MED ORDER — ENOXAPARIN SODIUM 40 MG/0.4ML ~~LOC~~ SOLN
40.0000 mg | SUBCUTANEOUS | Status: DC
  Administered 2014-12-04: 40 mg via SUBCUTANEOUS
  Filled 2014-12-04: qty 0.4

## 2014-12-04 MED ORDER — ACETAMINOPHEN 325 MG PO TABS: 650.0000 mg | ORAL_TABLET | Freq: Four times a day (QID) | ORAL | Status: DC | PRN

## 2014-12-04 MED ORDER — DIPHENHYDRAMINE HCL 25 MG PO CAPS: 25.0000 mg | ORAL_CAPSULE | Freq: Every evening | ORAL | Status: DC | PRN

## 2014-12-04 MED ORDER — LOSARTAN POTASSIUM 25 MG PO TABS
25.0000 mg | ORAL_TABLET | Freq: Every day | ORAL | Status: DC
  Administered 2014-12-05: 25 mg via ORAL
  Filled 2014-12-04: qty 1

## 2014-12-04 MED ORDER — NITROGLYCERIN 0.4 MG SL SUBL
0.4000 mg | SUBLINGUAL_TABLET | SUBLINGUAL | Status: DC | PRN
Start: 2014-12-04 — End: 2014-12-05

## 2014-12-04 MED ORDER — FUROSEMIDE 20 MG PO TABS
10.0000 mg | ORAL_TABLET | ORAL | Status: DC
  Administered 2014-12-05: 10 mg via ORAL
  Filled 2014-12-04: qty 1

## 2014-12-04 NOTE — Progress Notes (Signed)
Per MD Sainani order atarax for itching as patient takes this at home

## 2014-12-04 NOTE — ED Provider Notes (Signed)
[facility] Emergency Department Provider Note  ____________________________________________  Time seen: Approximately 11:19 AM  I have reviewed the triage vital signs and the nursing notes.   HISTORY  Chief Complaint Chest Pain    HPI SHANTI AGRESTI is a 81 y.o. female with past medical history significant for coronary artery disease status post CABG, CHF with EF of 40%, aortic stenosis status post aortic valve replacement, hypertension, hyperlipidemia who presents for evaluation of sudden onset stabbing left-sided chest pain associated with shortness of breath radiating into the right shoulder. He reports that it began at rest and lasted approximately an hour before resolving. Currently she has no chest pain. She does not know if it was worsened by exertion and she did not exert herself during this timeframe. Currently her pain is 0 out of 10. Pain was not pleuritic in nature, not ripping or tearing in nature did not radiate to the back or down towards the feet. She denies any vomiting, diarrhea, fevers or chills.   Past Medical History  Diagnosis Date  . Hypertension   . Hypercholesteremia   . Myocardial infarct     There are no active problems to display for this patient.   Past Surgical History  Procedure Laterality Date  . Cardiac valve replacement      Current Outpatient Rx  Name  Route  Sig  Dispense  Refill  . aspirin 81 MG tablet   Oral   Take 81 mg by mouth daily.         . furosemide (LASIX) 10 MG/ML solution   Oral   Take by mouth daily.         Marland Kitchen losartan (COZAAR) 25 MG tablet   Oral   Take 25 mg by mouth daily.         . montelukast (SINGULAIR) 10 MG tablet   Oral   Take 10 mg by mouth at bedtime.         . potassium chloride (K-DUR,KLOR-CON) 10 MEQ tablet   Oral   Take 10 mEq by mouth 2 (two) times daily.         . pravastatin (PRAVACHOL) 10 MG tablet   Oral   Take 10 mg by mouth daily.         .  ranitidine (ZANTAC) 75 MG tablet   Oral   Take 75 mg by mouth as needed for heartburn.         . sertraline (ZOLOFT) 25 MG tablet   Oral   Take 25 mg by mouth daily.           Allergies Alendronate  No family history on file.  Social History Social History  Substance Use Topics  . Smoking status: Never Smoker   . Smokeless tobacco: None  . Alcohol Use: No    Review of Systems Constitutional: No fever/chills Eyes: No visual changes. ENT: No sore throat. Cardiovascular: + chest pain. Respiratory: + shortness of breath. Gastrointestinal: No abdominal pain.  No nausea, no vomiting.  No diarrhea.  No constipation. Genitourinary: Negative for dysuria. Musculoskeletal: Negative for back pain. Skin: Negative for rash. Neurological: Negative for headaches, focal weakness or numbness.  10-point ROS otherwise negative.  ____________________________________________   PHYSICAL EXAM:  VITAL SIGNS: ED Triage Vitals  Enc Vitals Group     BP 12/04/14 1044 146/64 mmHg     Pulse Rate 12/04/14 1044 74     Resp 12/04/14 1044 17     Temp 12/04/14 1044 97.9 F (  36.6 C)     Temp Source 12/04/14 1044 Oral     SpO2 12/04/14 1044 99 %     Weight 12/04/14 1044 130 lb (58.968 kg)     Height 12/04/14 1044 5\' 5"  (1.651 m)     Head Cir --      Peak Flow --      Pain Score 12/04/14 1045 1     Pain Loc --      Pain Edu? --      Excl. in GC? --     Constitutional: Alert and oriented. Well appearing and in no acute distress. Eyes: Conjunctivae are normal. PERRL. EOMI. Head: Atraumatic. Nose: No congestion/rhinnorhea. Mouth/Throat: Mucous membranes are moist.  Oropharynx non-erythematous. Neck: No stridor.  Cardiovascular: Normal rate, regular rhythm.+ systolic murmur Good peripheral circulation. Respiratory: Normal respiratory effort.  No retractions. Lungs CTAB. Gastrointestinal: Soft and nontender. No distention. No abdominal bruits. No CVA tenderness. Genitourinary:  deferred Musculoskeletal: No lower extremity tenderness nor edema.  No joint effusions. Neurologic:  Normal speech and language. No gross focal neurologic deficits are appreciated. No gait instability. Skin:  Skin is warm, dry and intact. No rash noted. Psychiatric: Mood and affect are normal. Speech and behavior are normal.  ____________________________________________   LABS (all labs ordered are listed, but only abnormal results are displayed)  Labs Reviewed  BASIC METABOLIC PANEL  CBC  TROPONIN I   ____________________________________________  EKG  ED ECG REPORT I, Gayla Doss, the attending physician, personally viewed and interpreted this ECG.   Date: 12/04/2014  EKG Time: 10:54  Rate: 76  Rhythm: normal sinus rhythm  Axis: normal  Intervals:none  ST&T Change: No acute ST elevation. Q waves in 3, aVF, V1, V2, V3 appear similar to prior. She has evolving T-wave inversions in V1 and V2.  ____________________________________________  RADIOLOGY  Chest x-ray  IMPRESSION: No edema or consolidation. Scarring left base. Postoperative changes as described. ____________________________________________   PROCEDURES  Procedure(s) performed: None  Critical Care performed: No  ____________________________________________   INITIAL IMPRESSION / ASSESSMENT AND PLAN / ED COURSE  Pertinent labs & imaging results that were available during my care of the patient were reviewed by me and considered in my medical decision making (see chart for details).  ANDRIENNE HAVENER is a 81 y.o. female with past medical history significant for coronary artery disease status post CABG, CHF with EF of 40%, aortic stenosis status post aortic valve replacement, hypertension, hyperlipidemia who presents for evaluation of sudden onset stabbing left-sided chest pain. On exam, she is really well-appearing and in no acute distress. Vital signs stable, she is afebrile. EKG with Q waves in lead  3, aVF, V1, V2, V3. First troponin negative. Chest x-ray with no acute cardio pulmonary process. My concern is for ACS/unstable angina given her extensive cardiac history. Aspirin ordered. History of physical not consistent with acute aortic dissection or PE. I discussed the case with the hospitalist, Dr. Quentin Cornwall, for admission at 12:45 PM. ____________________________________________   FINAL CLINICAL IMPRESSION(S) / ED DIAGNOSES  Final diagnoses:  Chest pain, unspecified chest pain type      Gayla Doss, MD 12/04/14 1247

## 2014-12-04 NOTE — ED Notes (Signed)
Pt states chest pain that woke her up this AM about 6 am, states some SOB, pt has hx of CABG, pt denies any pain at this time, awake and alert in no distress

## 2014-12-04 NOTE — H&P (Signed)
[facility] - [place] at [facility]   PATIENT NAME: Tina Mclaughlin    MR#:  [id number]  DATE OF BIRTH:  [birth year]  DATE OF ADMISSION:  12/04/2014  PRIMARY CARE PHYSICIAN: Dortha Kern, MD   REQUESTING/REFERRING PHYSICIAN: Dr. Chari Manning  CHIEF COMPLAINT:   Chief Complaint  Patient presents with  . Chest Pain    HISTORY OF PRESENT ILLNESS:  Tina Mclaughlin  is a 81 y.o. female with a known history of hypertension, hyperlipidemia, history of previous MI, GERD, depression who presents to the hospital complaining of chest pain that woke her up this morning. She describes the pain as sharp in nature located in the left side of her chest area not radiating and not associated with any nausea, vomiting, diaphoresis, palpitations or any shortness of breath. The pain lasted for about 10-15 minutes and resolved on its own. Patient denies any exacerbating or any alleviating symptoms. Patient does have a previous history of MI and therefore came to the ER for further evaluation. Hospitalist services were contacted further treatment and evaluation.  PAST MEDICAL HISTORY:   Past Medical History  Diagnosis Date  . Hypertension   . Hypercholesteremia   . Myocardial infarct   . Depression   . GERD (gastroesophageal reflux disease)   . H/O aortic valve replacement     PAST SURGICAL HISTORY:   Past Surgical History  Procedure Laterality Date  . Cardiac valve replacement      SOCIAL HISTORY:   Social History  Substance Use Topics  . Smoking status: Never Smoker   . Smokeless tobacco: Not on file  . Alcohol Use: No    FAMILY HISTORY:   Family History  Problem Relation Age of Onset  . Liver cancer Mother   . Colon cancer Father     DRUG ALLERGIES:   Allergies  Allergen Reactions  . Alendronate Rash    REVIEW OF SYSTEMS:   Review of Systems  Constitutional: Negative for fever and weight loss.  HENT: Negative for congestion, nosebleeds and  tinnitus.   Eyes: Negative for blurred vision, double vision and redness.  Respiratory: Negative for cough, hemoptysis and shortness of breath.   Cardiovascular: Positive for chest pain. Negative for orthopnea, leg swelling and PND.  Gastrointestinal: Negative for nausea, vomiting, abdominal pain, diarrhea and melena.  Genitourinary: Negative for dysuria, urgency and hematuria.  Musculoskeletal: Negative for joint pain and falls.  Neurological: Negative for dizziness, tingling, sensory change, focal weakness, seizures, weakness and headaches.  Endo/Heme/Allergies: Negative for polydipsia. Does not bruise/bleed easily.  Psychiatric/Behavioral: Negative for depression and memory loss. The patient is not nervous/anxious.     MEDICATIONS AT HOME:   Prior to Admission medications   Medication Sig Start Date End Date Taking? Authorizing Provider  aspirin 81 MG tablet Take 81 mg by mouth daily.   Yes Historical Provider, MD  furosemide (LASIX) 10 MG/ML solution Take by mouth daily.   Yes Historical Provider, MD  losartan (COZAAR) 25 MG tablet Take 25 mg by mouth daily.   Yes Historical Provider, MD  montelukast (SINGULAIR) 10 MG tablet Take 10 mg by mouth at bedtime.   Yes Historical Provider, MD  potassium chloride (K-DUR,KLOR-CON) 10 MEQ tablet Take 10 mEq by mouth 2 (two) times daily.   Yes Historical Provider, MD  pravastatin (PRAVACHOL) 10 MG tablet Take 10 mg by mouth daily.   Yes Historical Provider, MD  ranitidine (ZANTAC) 75 MG tablet Take 75 mg by mouth as needed for heartburn.  Yes Historical Provider, MD  sertraline (ZOLOFT) 25 MG tablet Take 25 mg by mouth daily.   Yes Historical Provider, MD      VITAL SIGNS:  Blood pressure 139/73, pulse 74, temperature 97.9 F (36.6 C), temperature source Oral, resp. rate 17, height  (1.651 m), weight 58.968 kg (130 lb), SpO2 99 %.  PHYSICAL EXAMINATION:  Physical Exam  GENERAL:  81 y.o.-year-old patient lying in the bed with no acute  distress.  EYES: Pupils equal, round, reactive to light and accommodation. No scleral icterus. Extraocular muscles intact.  HEENT: Head atraumatic, normocephalic. Oropharynx and nasopharynx clear. No oropharyngeal erythema, moist oral mucosa  NECK:  Supple, no jugular venous distention. No thyroid enlargement, no tenderness.  LUNGS: Normal breath sounds bilaterally, no wheezing, rales, rhonchi. No use of accessory muscles of respiration.  CARDIOVASCULAR: S1, S2 RRR. 2/6 his systolic ejection murmur heard at the right sternal border, no rubs, gallops, clicks.  ABDOMEN: Soft, nontender, nondistended. Bowel sounds present. No organomegaly or mass.  EXTREMITIES: No pedal edema, cyanosis, or clubbing. + 2 pedal & radial pulses b/l.   NEUROLOGIC: Cranial nerves II through XII are intact. No focal Motor or sensory deficits appreciated b/l PSYCHIATRIC: The patient is alert and oriented x 3. Good affect.  SKIN: No obvious rash, lesion, or ulcer.   LABORATORY PANEL:   CBC  Recent Labs Lab 12/04/14 1048  WBC 8.2  HGB 12.8  HCT 38.3  PLT 207   ------------------------------------------------------------------------------------------------------------------  Chemistries   Recent Labs Lab 12/04/14 1048  NA 139  K 4.4  CL 106  CO2 27  GLUCOSE 98  BUN 16  CREATININE 0.66  CALCIUM 9.0   ------------------------------------------------------------------------------------------------------------------  Cardiac Enzymes  Recent Labs Lab 12/04/14 1048  TROPONINI <0.03   ------------------------------------------------------------------------------------------------------------------  RADIOLOGY:  Dg Chest 2 View  12/04/2014   CLINICAL DATA:  Chest pain.  Hypertension.  EXAM: CHEST  2 VIEW  COMPARISON:  Chest CT January 08, 2011  FINDINGS: There is mild scarring in the left base. There is no edema or consolidation. Heart size is upper normal with pulmonary vascularity within normal  limits. There is an aortic valve replacement. Patient is also status post coronary artery bypass grafting. There are surgical clips in the right axillary region. There is no acute fracture. No adenopathy. There is atherosclerotic change in aorta. There is calcification in the mid right axillary region.  IMPRESSION: No edema or consolidation. Scarring left base. Postoperative changes as described.   Electronically Signed   By: Bretta Bang III M.D.   On: 12/04/2014 11:30     IMPRESSION AND PLAN:   81 year old female with past medical history of hypertension, hyperlipidemia, history of previous MI, GERD, depression who presented to the hospital with chest pain.  #1 chest pain-patient does have risk factors for coronary disease given her previous history of MI and history of hypertension and hyperlipidemia. -I will observe overnight on telemetry, follow serial cardiac markers. -EKG shows no acute ST or T-wave changes. I will continue aspirin, nitroglycerin, morphine and get a nuclear medicine stress test in the morning.  #2 hypertension-continue losartan.    3 hyperlipidemia-continue Pravachol.  #4 depression-continue Zoloft.    All the records are reviewed and case discussed with ED provider. Management plans discussed with the patient, family and they are in agreement.  CODE STATUS: Full  TOTAL TIME TAKING CARE OF THIS PATIENT: 45 minutes.    Houston Siren M.D on 12/04/2014 at 1:06 PM  Between 7am to 6pm -  Pager - [phone]  After 6pm go to [email or web address] - password [email or web address] [facility]  [place] [facility]  Office  [phone]  CC: Primary care physician; Dortha Kern, MD

## 2014-12-04 NOTE — ED Notes (Signed)
Pt c/o left sided chest pain and right shoulder pain since 6am .the patient is in NAD on arrival, respirations WNL, skin is warm and dry.Marland Kitchen

## 2014-12-04 NOTE — Progress Notes (Signed)
Per MD Sainani place order for lasix the same way patient takes it at home

## 2014-12-05 ENCOUNTER — Encounter: Payer: Self-pay | Admitting: Radiology

## 2014-12-05 LAB — BASIC METABOLIC PANEL
ANION GAP: 4 — AB (ref 5–15)
BUN: 16 mg/dL (ref 6–20)
CALCIUM: 8.3 mg/dL — AB (ref 8.9–10.3)
CHLORIDE: 111 mmol/L (ref 101–111)
CO2: 25 mmol/L (ref 22–32)
Creatinine, Ser: 0.77 mg/dL (ref 0.44–1.00)
GFR calc Af Amer: >60 mL/min (ref >60)
GFR calc non Af Amer: >60 mL/min (ref >60)
GLUCOSE: 86 mg/dL (ref 65–99)
POTASSIUM: 4.1 mmol/L (ref 3.5–5.1)
Sodium: 140 mmol/L (ref 135–145)

## 2014-12-05 LAB — CBC
HEMATOCRIT: 34.9 % — AB (ref 35.0–47.0)
HEMOGLOBIN: 11.8 g/dL — AB (ref 12.0–16.0)
MCH: 31.3 pg (ref 26.0–34.0)
MCHC: 33.7 g/dL (ref 32.0–36.0)
MCV: 92.8 fL (ref 80.0–100.0)
Platelets: 190 10*3/uL (ref 150–440)
RBC: 3.76 MIL/uL — ABNORMAL LOW (ref 3.80–5.20)
RDW: 12.8 % (ref 11.5–14.5)
WBC: 6 10*3/uL (ref 3.6–11.0)

## 2014-12-05 LAB — NM MYOCAR MULTI W/SPECT W/WALL MOTION / EF
CHL CUP NUCLEAR SSS: 14
CHL CUP RESTING HR STRESS: 71 {beats}/min
CHL CUP STRESS STAGE 2 GRADE: 0 %
CHL CUP STRESS STAGE 2 HR: 71 {beats}/min
CHL CUP STRESS STAGE 2 SPEED: 0 mph
CHL CUP STRESS STAGE 3 HR: 71 {beats}/min
CHL CUP STRESS STAGE 4 HR: 91 {beats}/min
CHL CUP STRESS STAGE 5 SPEED: 0 mph
CHL CUP STRESS STAGE 6 DBP: 67 mmHg
CHL CUP STRESS STAGE 6 GRADE: 0 %
CSEPPMHR: 65 %
Estimated workload: 1 METS
LVDIAVOL: 111 mL
LVSYSVOL: 48 mL
NUC STRESS TID: 1.12
Peak HR: 91 {beats}/min
SDS: 1
SRS: 19
Stage 1 Grade: 0 %
Stage 1 HR: 72 {beats}/min
Stage 1 Speed: 0 mph
Stage 3 Grade: 0 %
Stage 3 Speed: 0 mph
Stage 4 Grade: 0 %
Stage 4 Speed: 0 mph
Stage 5 Grade: 0 %
Stage 5 HR: 97 {beats}/min
Stage 6 HR: 91 {beats}/min
Stage 6 SBP: 135 mmHg
Stage 6 Speed: 0 mph

## 2014-12-05 MED ORDER — TECHNETIUM TC 99M SESTAMIBI - CARDIOLITE
13.8990 | Freq: Once | INTRAVENOUS | Status: AC | PRN
  Administered 2014-12-05: 13.899 via INTRAVENOUS

## 2014-12-05 MED ORDER — REGADENOSON 0.4 MG/5ML IV SOLN
0.4000 mg | Freq: Once | INTRAVENOUS | Status: AC
  Administered 2014-12-05: 0.4 mg via INTRAVENOUS

## 2014-12-05 MED ORDER — TECHNETIUM TC 99M SESTAMIBI - CARDIOLITE
30.4450 | Freq: Once | INTRAVENOUS | Status: AC | PRN
  Administered 2014-12-05: 30.445 via INTRAVENOUS

## 2014-12-05 NOTE — Progress Notes (Signed)
All D/C forms completed and explained to pt. Personal belongings returned to pt. IV D/C intact. Pt verbalizes understanding of all D/C instructions.

## 2014-12-05 NOTE — Discharge Summary (Signed)
[facility] - [place] at [facility]   PATIENT NAME: Tina Mclaughlin    MR#:  [id number]  DATE OF BIRTH:  05-Mar-1931  DATE OF ADMISSION:  12/04/2014 ADMITTING PHYSICIAN: Houston Siren, MD  DATE OF DISCHARGE: 12/05/2014 12:48 PM  PRIMARY CARE PHYSICIAN: BLISS, Doreene Nest, MD    ADMISSION DIAGNOSIS:  Chest pain, unspecified chest pain type [R07.9]   DISCHARGE DIAGNOSIS:  Atypical chest pain.  SECONDARY DIAGNOSIS:   Past Medical History  Diagnosis Date  . Hypertension   . Hypercholesteremia   . Myocardial infarct   . Depression   . GERD (gastroesophageal reflux disease)   . H/O aortic valve replacement     HOSPITAL COURSE:   Atypical chest pain. The patient has no chest pain after admission for the chest is sore. Troponin level has been negative. Stress test is normal. She has been treated with aspirin and Pravachol.  Hypertension, controlled. She has been treated with the losartan.  DISCHARGE CONDITIONS:   Stable, discharged home today.  CONSULTS OBTAINED:     DRUG ALLERGIES:   Allergies  Allergen Reactions  . Alendronate Rash    DISCHARGE MEDICATIONS:   Discharge Medication List as of 12/05/2014 11:57 AM    CONTINUE these medications which have NOT CHANGED   Details  aspirin 81 MG tablet Take 81 mg by mouth daily., Until Discontinued, Historical Med    furosemide (LASIX) 20 MG tablet Take 10 mg by mouth every other day., Until Discontinued, Historical Med    hydrOXYzine (ATARAX/VISTARIL) 25 MG tablet Take 25 mg by mouth every 6 (six) hours as needed., Until Discontinued, Historical Med    losartan (COZAAR) 25 MG tablet Take 25 mg by mouth daily., Until Discontinued, Historical Med    montelukast (SINGULAIR) 10 MG tablet Take 10 mg by mouth at bedtime., Until Discontinued, Historical Med    potassium chloride (K-DUR,KLOR-CON) 10 MEQ tablet Take 10 mEq by mouth 2 (two) times daily., Until Discontinued, Historical Med     pravastatin (PRAVACHOL) 10 MG tablet Take 10 mg by mouth daily., Until Discontinued, Historical Med    ranitidine (ZANTAC) 75 MG tablet Take 75 mg by mouth as needed for heartburn., Until Discontinued, Historical Med    sertraline (ZOLOFT) 25 MG tablet Take 25 mg by mouth daily., Until Discontinued, Historical Med         DISCHARGE INSTRUCTIONS:     If you experience worsening of your admission symptoms, develop shortness of breath, life threatening emergency, suicidal or homicidal thoughts you must seek medical attention immediately by calling 911 or calling your MD immediately  if symptoms less severe.  You Must read complete instructions/literature along with all the possible adverse reactions/side effects for all the Medicines you take and that have been prescribed to you. Take any new Medicines after you have completely understood and accept all the possible adverse reactions/side effects.   Please note  You were cared for by a hospitalist during your hospital stay. If you have any questions about your discharge medications or the care you received while you were in the hospital after you are discharged, you can call the unit and asked to speak with the hospitalist on call if the hospitalist that took care of you is not available. Once you are discharged, your primary care physician will handle any further medical issues. Please note that NO REFILLS for any discharge medications will be authorized once you are discharged, as it is imperative that you return to your primary care  physician (or establish a relationship with a primary care physician if you do not have one) for your aftercare needs so that they can reassess your need for medications and monitor your lab values.    Today   SUBJECTIVE   No complaint.   VITAL SIGNS:  Blood pressure 127/56, pulse 72, temperature 98.3 F (36.8 C), temperature source Oral, resp. rate 17, height 5\' 5"  (1.651 m), weight 61.417 kg (135 lb  6.4 oz), SpO2 100 %.  I/O:   Intake/Output Summary (Last 24 hours) at 12/05/14 1520 Last data filed at 12/05/14 0444  Gross per 24 hour  Intake      0 ml  Output    550 ml  Net   -550 ml    PHYSICAL EXAMINATION:  GENERAL:  81 y.o.-year-old patient lying in the bed with no acute distress.  EYES: Pupils equal, round, reactive to light and accommodation. No scleral icterus. Extraocular muscles intact.  HEENT: Head atraumatic, normocephalic. Oropharynx and nasopharynx clear.  NECK:  Supple, no jugular venous distention. No thyroid enlargement, no tenderness.  LUNGS: Normal breath sounds bilaterally, no wheezing, rales,rhonchi or crepitation. No use of accessory muscles of respiration.  CARDIOVASCULAR: S1, S2 normal. No murmurs, rubs, or gallops.  ABDOMEN: Soft, non-tender, non-distended. Bowel sounds present. No organomegaly or mass.  EXTREMITIES: No pedal edema, cyanosis, or clubbing.  NEUROLOGIC: Cranial nerves II through XII are intact. Muscle strength 5/5 in all extremities. Sensation intact. Gait not checked.  PSYCHIATRIC: The patient is alert and oriented x 3.  SKIN: No obvious rash, lesion, or ulcer.   DATA REVIEW:   CBC  Recent Labs Lab 12/05/14 0425  WBC 6.0  HGB 11.8*  HCT 34.9*  PLT 190    Chemistries   Recent Labs Lab 12/05/14 0425  NA 140  K 4.1  CL 111  CO2 25  GLUCOSE 86  BUN 16  CREATININE 0.77  CALCIUM 8.3*    Cardiac Enzymes  Recent Labs Lab 12/04/14 1839  TROPONINI <0.03    Microbiology Results  No results found for this or any previous visit.  RADIOLOGY:  Dg Chest 2 View  12/04/2014   CLINICAL DATA:  Chest pain.  Hypertension.  EXAM: CHEST  2 VIEW  COMPARISON:  Chest CT January 08, 2011  FINDINGS: There is mild scarring in the left base. There is no edema or consolidation. Heart size is upper normal with pulmonary vascularity within normal limits. There is an aortic valve replacement. Patient is also status post coronary artery bypass  grafting. There are surgical clips in the right axillary region. There is no acute fracture. No adenopathy. There is atherosclerotic change in aorta. There is calcification in the mid right axillary region.  IMPRESSION: No edema or consolidation. Scarring left base. Postoperative changes as described.   Electronically Signed   By: Bretta Bang III M.D.   On: 12/04/2014 11:30        Management plans discussed with the patient, family and they are in agreement.  CODE STATUS:     Code Status Orders        Start     Ordered   12/04/14 1408  Full code   Continuous     12/04/14 1408    Advance Directive Documentation        Most Recent Value   Type of Advance Directive  Healthcare Power of Attorney, Living will   Pre-existing out of facility DNR order (yellow form or pink MOST form)     "  MOST" Form in Place?        TOTAL TIME TAKING CARE OF THIS PATIENT: 33 minutes.    Shaune Pollack M.D on 12/05/2014 at 3:20 PM  Between 7am to 6pm - Pager - [phone]  After 6pm go to [email or web address] - password [email or web address] [facility]  [place] [facility]  Office  [phone]  CC: Primary care physician; Dortha Kern, MD

## 2014-12-05 NOTE — Discharge Instructions (Signed)
Heart healthy diet. °Activity as tolerated. °

## 2014-12-07 ENCOUNTER — Emergency Department: Payer: Medicare Other

## 2014-12-07 ENCOUNTER — Encounter: Payer: Self-pay | Admitting: Emergency Medicine

## 2014-12-07 ENCOUNTER — Emergency Department
Admission: EM | Admit: 2014-12-07 | Discharge: 2014-12-07 | Disposition: A | Discharge status: Home / Self Care | Payer: Medicare Other | Attending: Emergency Medicine | Admitting: Emergency Medicine

## 2014-12-07 DIAGNOSIS — R079 Chest pain, unspecified: Secondary | ICD-10-CM | POA: Diagnosis present

## 2014-12-07 DIAGNOSIS — I10 Essential (primary) hypertension: Secondary | ICD-10-CM | POA: Diagnosis not present

## 2014-12-07 DIAGNOSIS — R0602 Shortness of breath: Secondary | ICD-10-CM | POA: Insufficient documentation

## 2014-12-07 LAB — BASIC METABOLIC PANEL
Anion gap: 7 (ref 5–15)
BUN: 17 mg/dL (ref 6–20)
CALCIUM: 9.1 mg/dL (ref 8.9–10.3)
CHLORIDE: 107 mmol/L (ref 101–111)
CO2: 24 mmol/L (ref 22–32)
CREATININE: 0.7 mg/dL (ref 0.44–1.00)
GFR calc Af Amer: >60 mL/min (ref >60)
GFR calc non Af Amer: >60 mL/min (ref >60)
Glucose, Bld: 94 mg/dL (ref 65–99)
Potassium: 3.8 mmol/L (ref 3.5–5.1)
SODIUM: 138 mmol/L (ref 135–145)

## 2014-12-07 LAB — CBC
HCT: 36.8 % (ref 35.0–47.0)
Hemoglobin: 12.3 g/dL (ref 12.0–16.0)
MCH: 31.1 pg (ref 26.0–34.0)
MCHC: 33.4 g/dL (ref 32.0–36.0)
MCV: 93.1 fL (ref 80.0–100.0)
PLATELETS: 216 10*3/uL (ref 150–440)
RBC: 3.95 MIL/uL (ref 3.80–5.20)
RDW: 12.9 % (ref 11.5–14.5)
WBC: 8.9 10*3/uL (ref 3.6–11.0)

## 2014-12-07 LAB — TROPONIN I: Troponin I: <0.03 ng/mL (ref <0.031)

## 2014-12-07 NOTE — Discharge Instructions (Signed)

## 2014-12-07 NOTE — ED Notes (Signed)
States her pain worsens with movement.

## 2014-12-07 NOTE — ED Provider Notes (Addendum)
[facility] Emergency Department Provider Note     Time seen: ----------------------------------------- 12:06 PM on 12/07/2014 -----------------------------------------    I have reviewed the triage vital signs and the nursing notes.   HISTORY  Chief Complaint Chest Pain    HPI Tina Mclaughlin is a 81 y.o. female who presents ER for midsternal chest pain. Patient states she was seen here and kept overnight for the same on Wednesday. She denies any pain currently, stated the pain started at 3 AM and regressed about 7 AM. She did have some shortness of breath that has resolved, she denies any other complaints.   Past Medical History  Diagnosis Date  . Hypertension   . Hypercholesteremia   . Myocardial infarct   . Depression   . GERD (gastroesophageal reflux disease)   . H/O aortic valve replacement     Patient Active Problem List   Diagnosis Date Noted  . Chest pain 12/04/2014    Past Surgical History  Procedure Laterality Date  . Cardiac valve replacement      Allergies Alendronate  Social History Social History  Substance Use Topics  . Smoking status: Never Smoker   . Smokeless tobacco: Never Used  . Alcohol Use: No    Review of Systems Constitutional: Negative for fever. Eyes: Negative for visual changes. ENT: Negative for sore throat. Cardiovascular: Positive for chest pain Respiratory: As shortness of breath Gastrointestinal: Negative for abdominal pain, vomiting and diarrhea. Genitourinary: Negative for dysuria. Musculoskeletal: Negative for back pain. Skin: Negative for rash. Neurological: Negative for headaches, focal weakness or numbness.  10-point ROS otherwise negative.  ____________________________________________   PHYSICAL EXAM:  VITAL SIGNS: ED Triage Vitals  Enc Vitals Group     BP 12/07/14 1021 116/56 mmHg     Pulse Rate 12/07/14 1021 71     Resp 12/07/14 1021 18     Temp 12/07/14 1021 97.7 F  (36.5 C)     Temp Source 12/07/14 1021 Oral     SpO2 12/07/14 1021 100 %     Weight 12/07/14 1021 130 lb (58.968 kg)     Height 12/07/14 1021  (1.651 m)     Head Cir --      Peak Flow --      Pain Score --      Pain Loc --      Pain Edu? --      Excl. in GC? --     Constitutional: Alert and oriented. Well appearing and in no distress. Eyes: Conjunctivae are normal. PERRL. Normal extraocular movements. ENT   Head: Normocephalic and atraumatic.   Nose: No congestion/rhinnorhea.   Mouth/Throat: Mucous membranes are moist.   Neck: No stridor. Cardiovascular: Normal rate, regular rhythm. Normal and symmetric distal pulses are present in all extremities. No murmurs, rubs, or gallops. Respiratory: Normal respiratory effort without tachypnea nor retractions. Breath sounds are clear and equal bilaterally. No wheezes/rales/rhonchi. Gastrointestinal: Soft and nontender. No distention. No abdominal bruits.  Musculoskeletal: Nontender with normal range of motion in all extremities. No joint effusions.  No lower extremity tenderness nor edema. Neurologic:  Normal speech and language. No gross focal neurologic deficits are appreciated. Speech is normal. No gait instability. Skin:  Skin is warm, dry and intact. No rash noted. Psychiatric: Mood and affect are normal. Speech and behavior are normal. Patient exhibits appropriate insight and judgment. ____________________________________________  EKG: Interpreted by me. Normal sinus rhythm rate of 69 bpm, normal PR interval, normal QRS with, normal QT interval.  There is likely septal infarct age indeterminate.  ____________________________________________  ED COURSE:  Pertinent labs & imaging results that were available during my care of the patient were reviewed by me and considered in my medical decision making (see chart for details). Patient is in no acute distress, will check recent hospitalization and cardiac labs from  today. ____________________________________________    LABS (pertinent positives/negatives)  Labs Reviewed  BASIC METABOLIC PANEL  CBC  TROPONIN I    RADIOLOGY  Chest x-ray is unremarkable  ____________________________________________  FINAL ASSESSMENT AND PLAN  Chest pain  Plan: Patient with labs and imaging as dictated above. No clear etiology for her chest pain is identified. Her labs are unremarkable, recent hospitalization was unremarkable. She stable for outpatient follow-up   Emily Filbert, MD   Emily Filbert, MD 12/07/14 8119  Emily Filbert, MD 12/07/14 [phone]

## 2014-12-07 NOTE — ED Notes (Signed)
Pt states she woke up with midsternal cp, was seen here Wed for the same. Denies any pain at this time. Appears in no distress.

## 2016-05-13 IMAGING — CR DG FOOT COMPLETE 3+V*L*
3 series · 3 of 3 positions shown · non-contrast
Comparison: None.

CLINICAL DATA: Trauma. The patient dropped a wire shelf on her foot
2 days ago. Pain and swelling.

EXAM:
LEFT FOOT - COMPLETE 3+ VIEW

[foot ap]
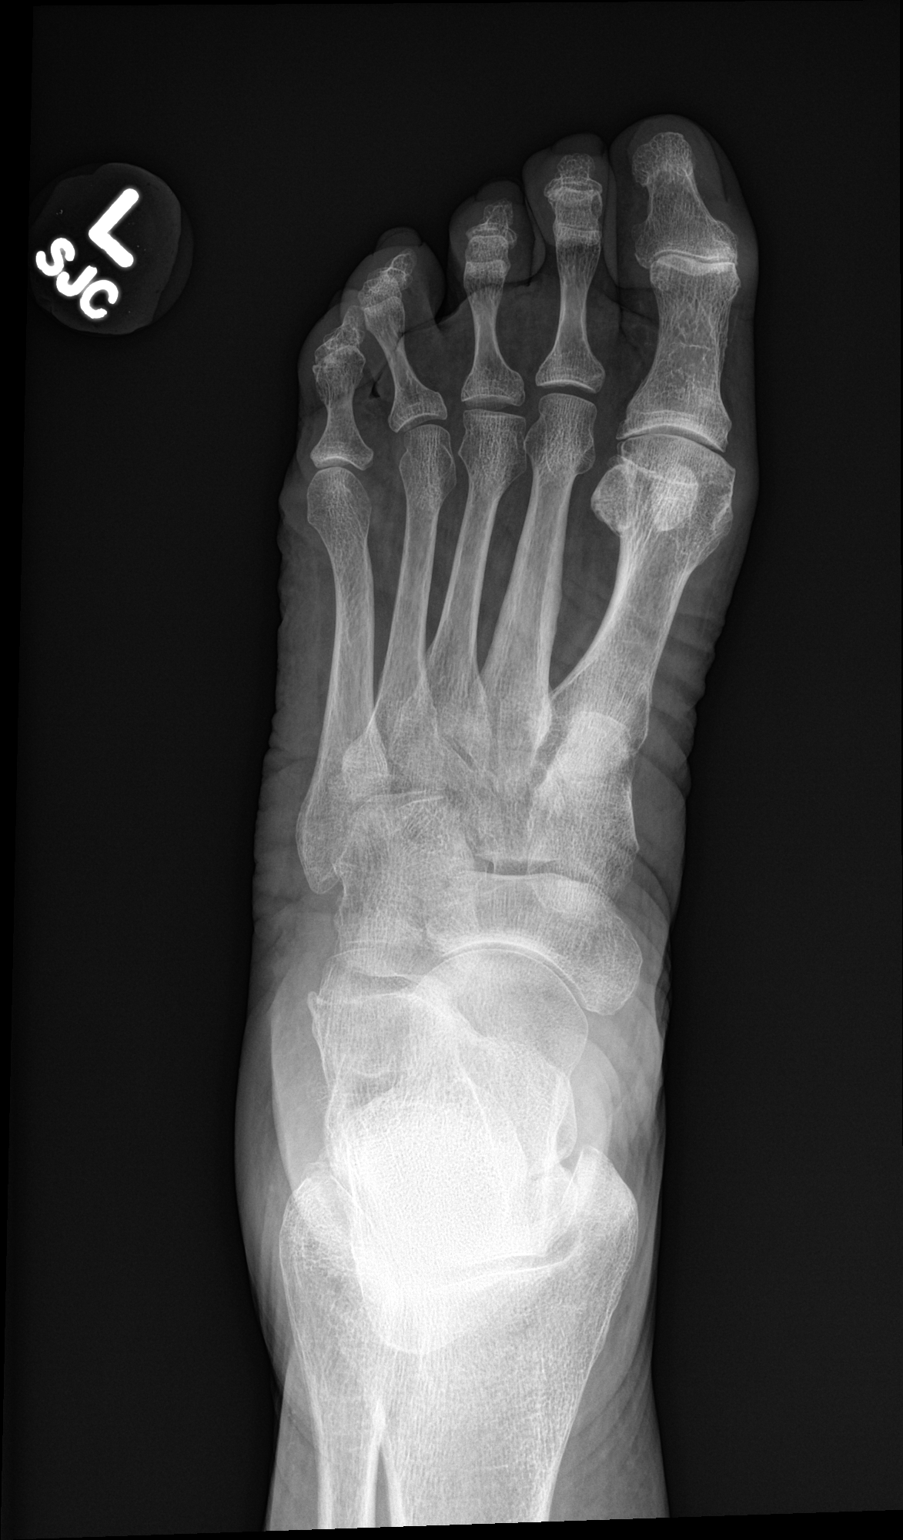

[foot obl]
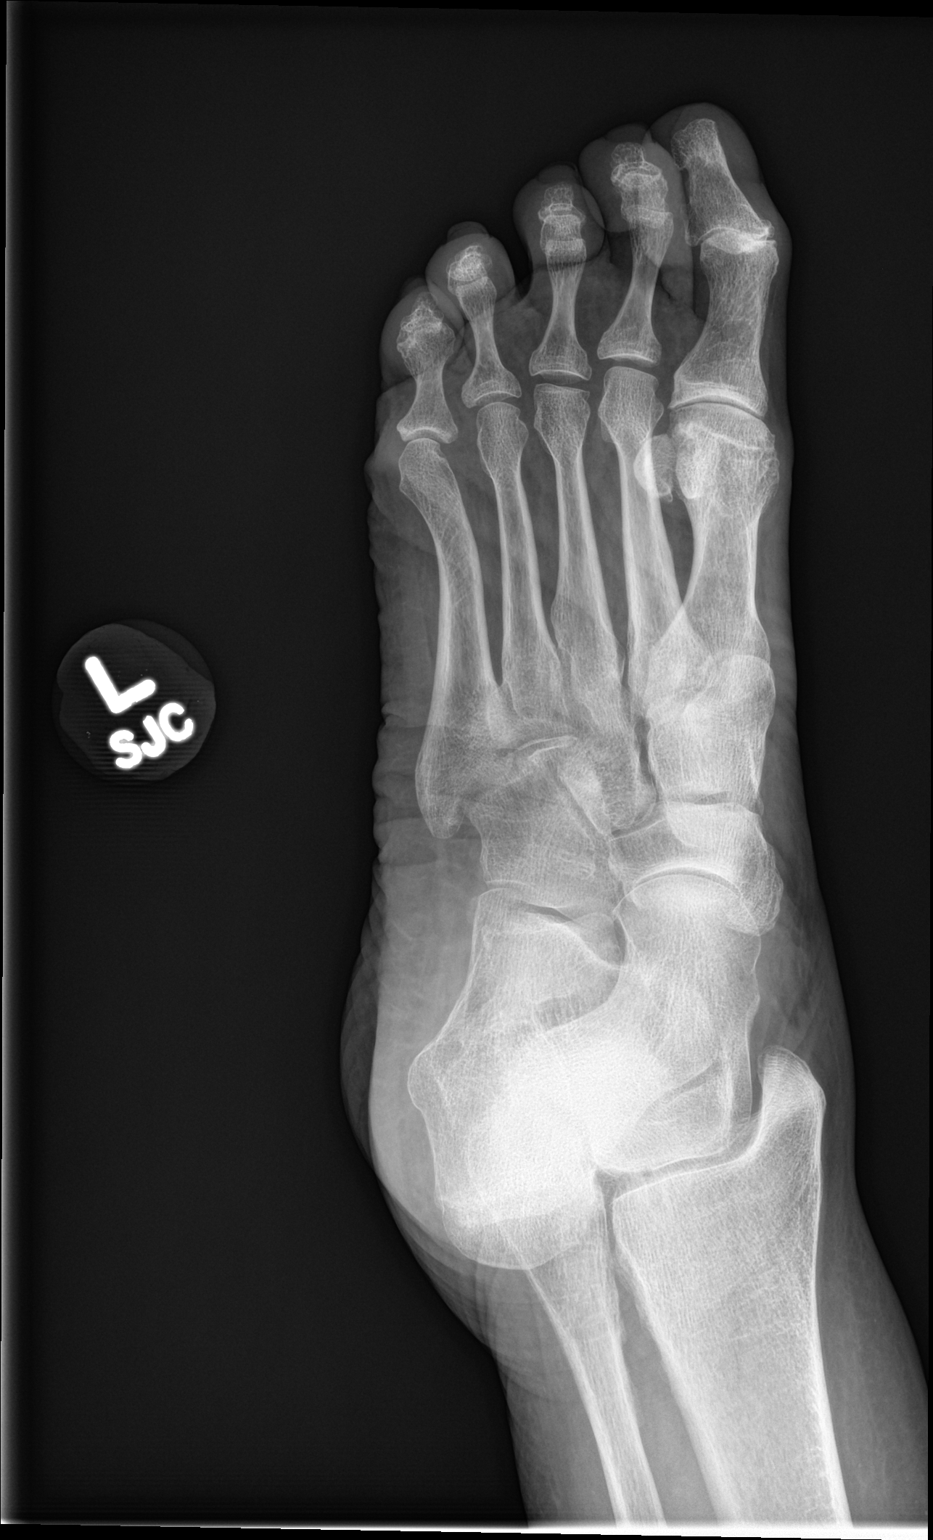

[foot lat]
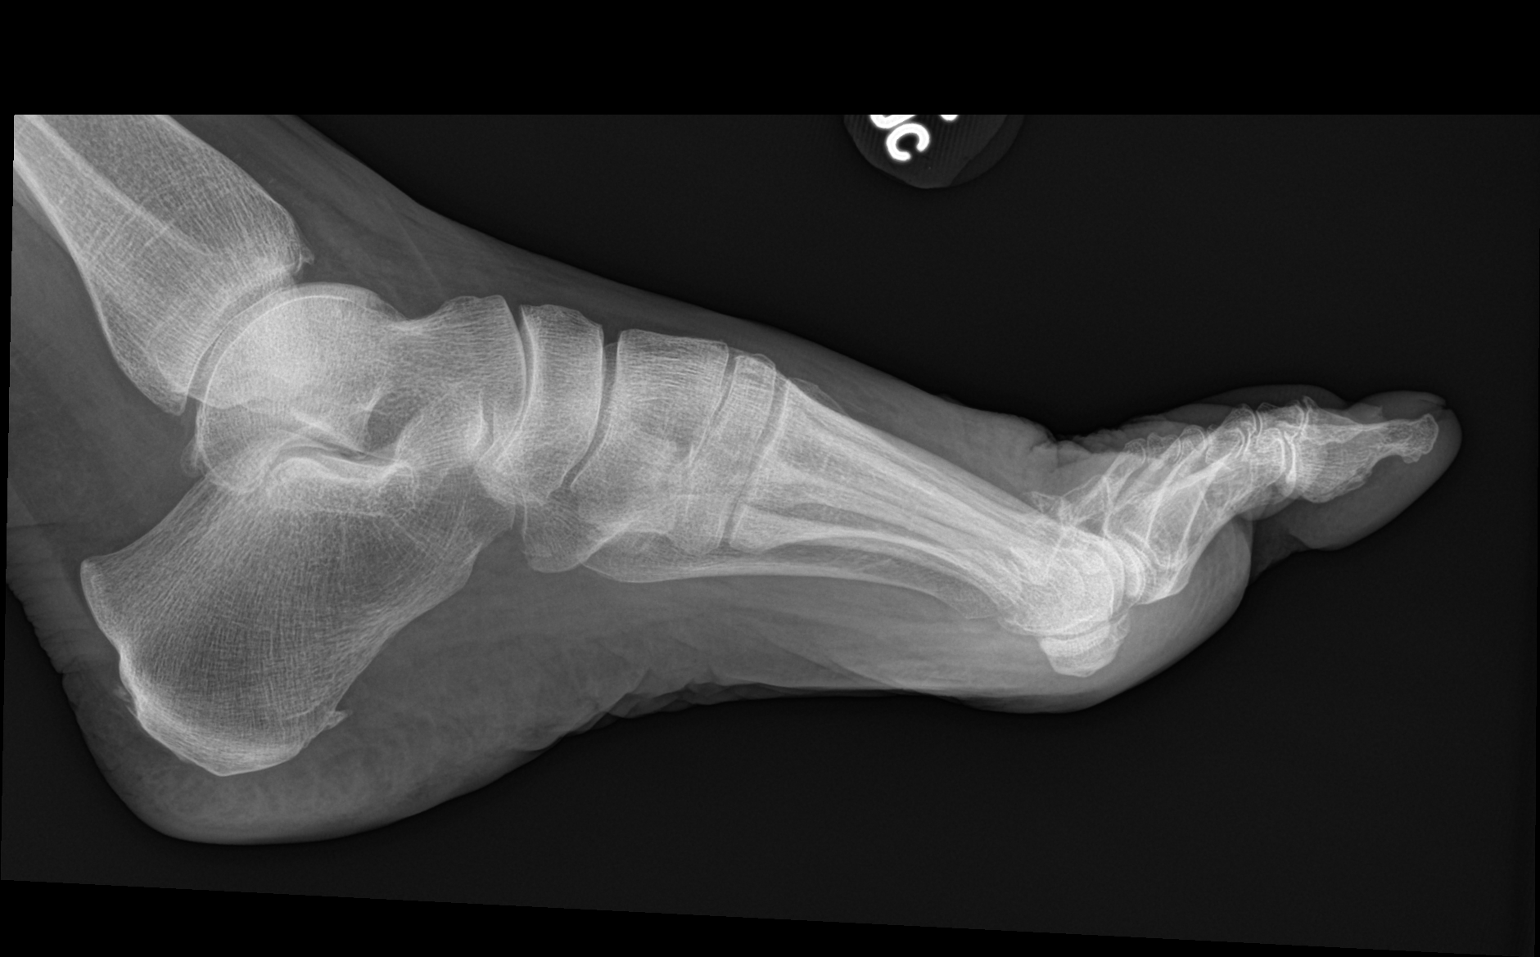

[3 of 3 positions shown; findings below may reference images not displayed]

FINDINGS: Soft tissue swelling is noted over the distal foot. No acute
fracture is evident. There is no radiopaque foreign body. The ankle
is intact. A small plantar calcaneal spur is noted.
IMPRESSION: Soft tissue swelling over the dorsum of foot without an acute
fracture.

## 2016-08-26 IMAGING — CR DG CHEST 2V
1 series · 2 of 2 positions shown · non-contrast
Comparison: 12/04/2014

CLINICAL DATA: Midsternal chest pain this morning

EXAM:
CHEST  2 VIEW

[Series 1: w chest pa · 0.14mm/px · 2 of 2 slices shown]
[im 1/2]
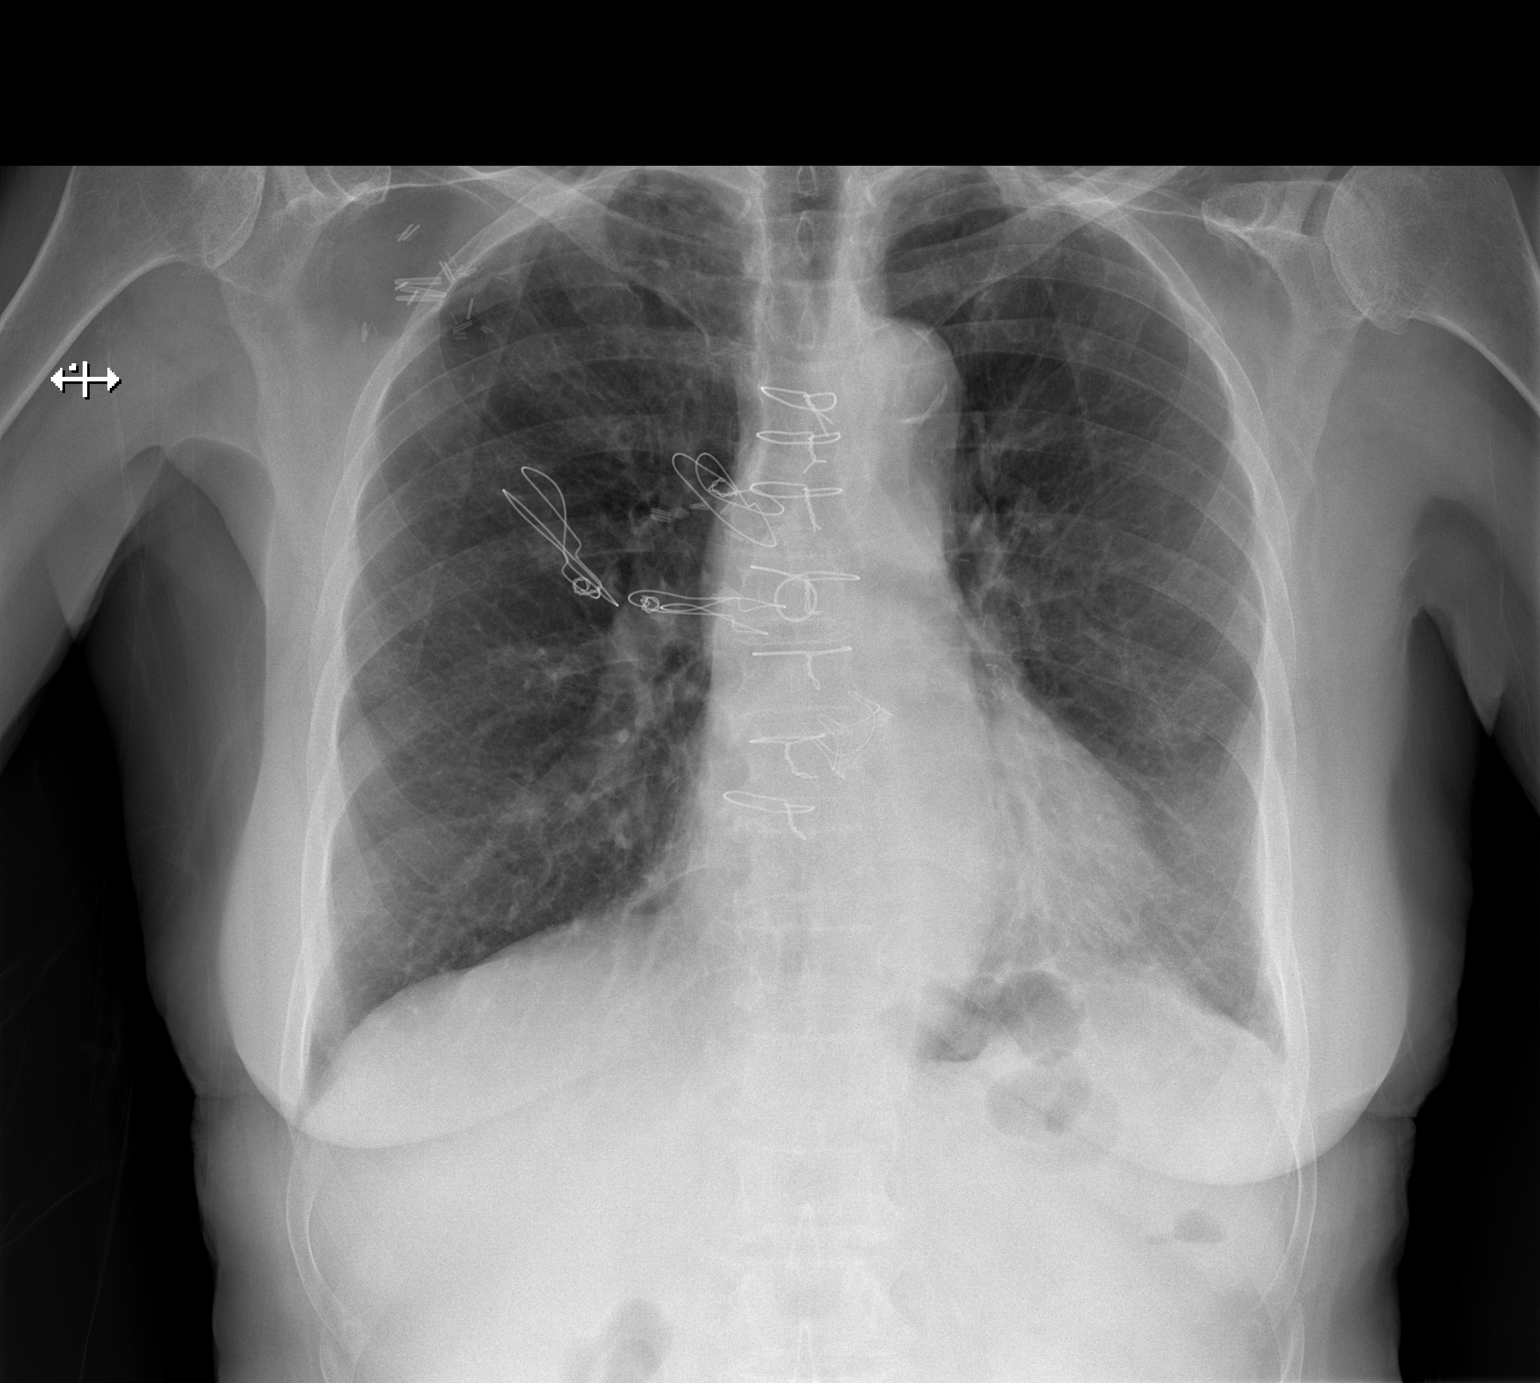
[im 2/2]
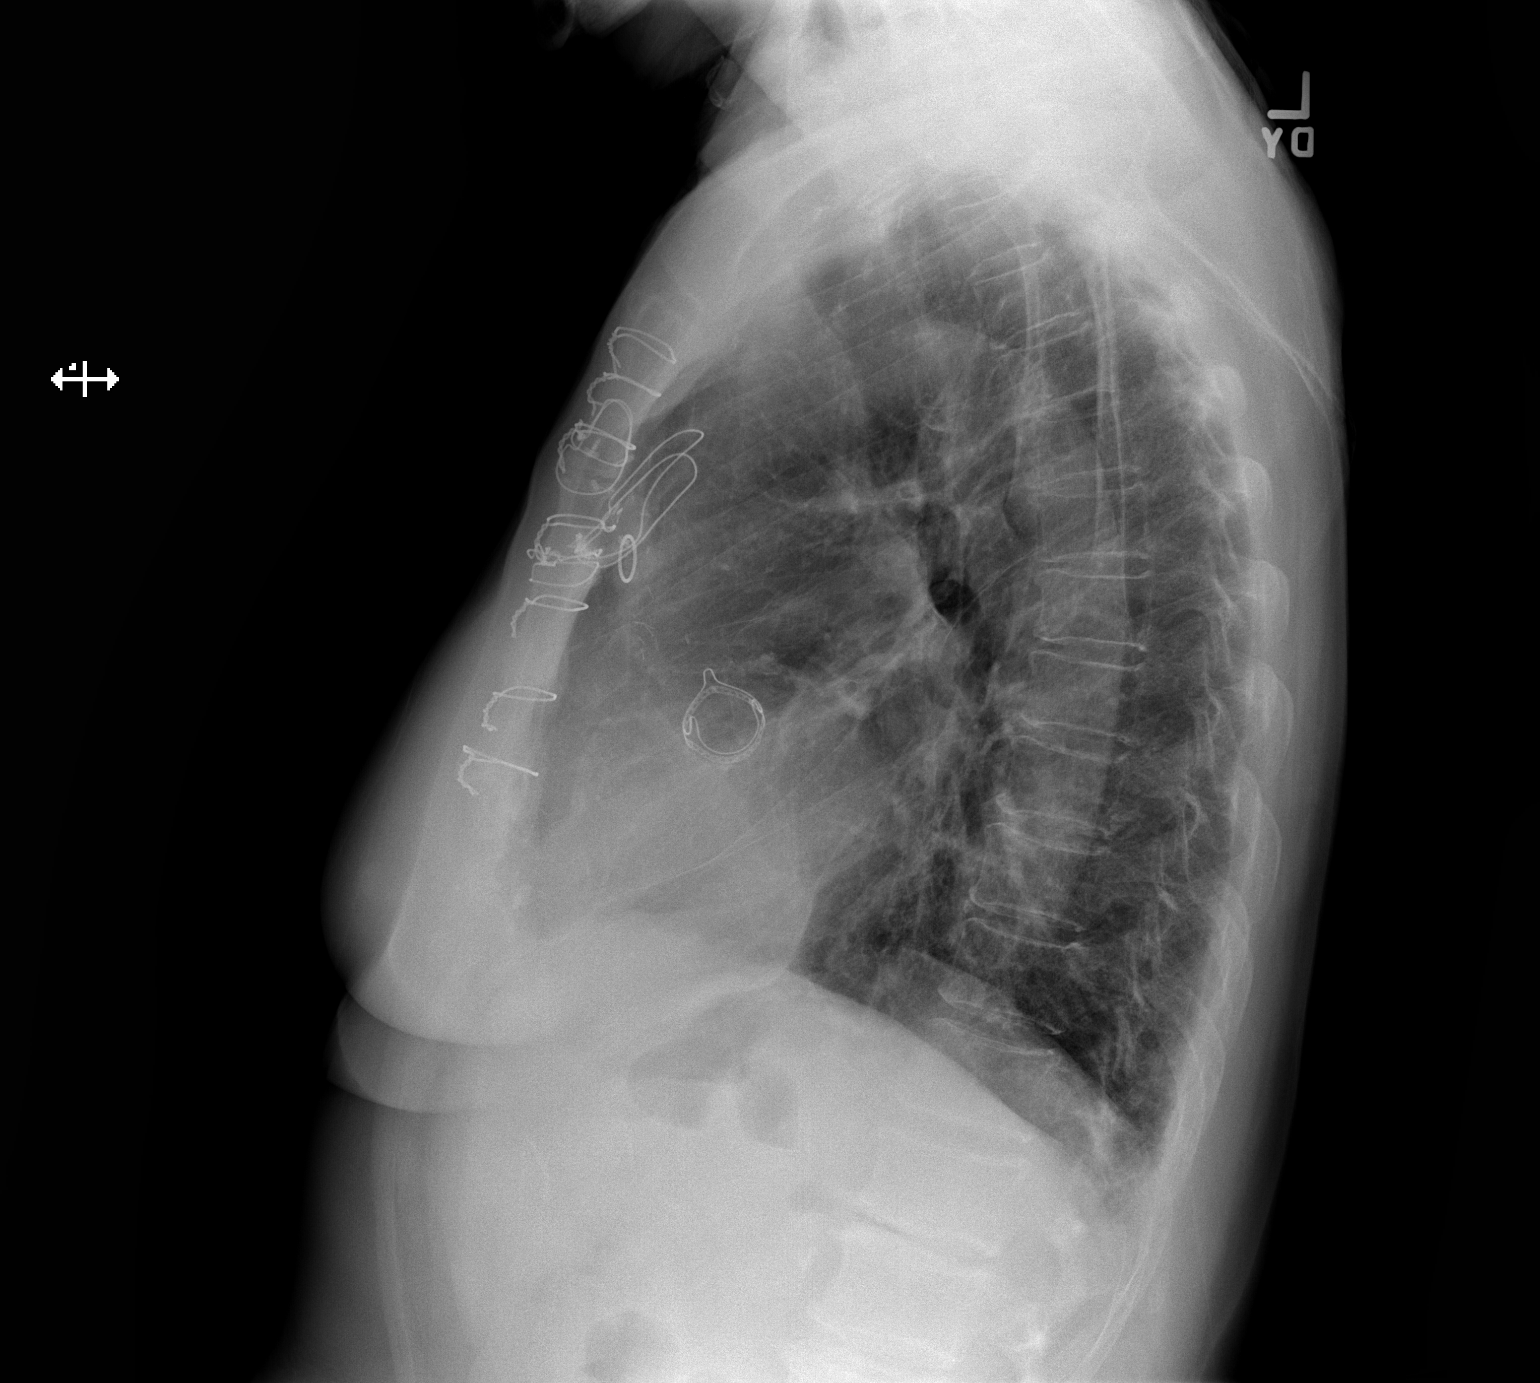

[2 of 2 positions shown; findings below may reference images not displayed]

FINDINGS: Status post previous CABG and replacement cardiac valve placement.
Heart size and vascular pattern normal. Uncoiling and calcification
of the aorta stable. Vascular pattern normal. Mild atelectasis left
base. Lungs otherwise clear.
IMPRESSION: No active cardiopulmonary disease.

## 2016-09-06 ENCOUNTER — Ambulatory Visit (INDEPENDENT_AMBULATORY_CARE_PROVIDER_SITE_OTHER): Payer: Medicare Other | Admitting: Podiatry

## 2016-09-06 ENCOUNTER — Encounter: Payer: Self-pay | Admitting: Podiatry

## 2016-09-06 ENCOUNTER — Ambulatory Visit (INDEPENDENT_AMBULATORY_CARE_PROVIDER_SITE_OTHER): Payer: Medicare Other

## 2016-09-06 DIAGNOSIS — M205X2 Other deformities of toe(s) (acquired), left foot: Secondary | ICD-10-CM | POA: Diagnosis not present

## 2016-09-06 DIAGNOSIS — M778 Other enthesopathies, not elsewhere classified: Secondary | ICD-10-CM

## 2016-09-06 DIAGNOSIS — M7752 Other enthesopathy of left foot: Secondary | ICD-10-CM | POA: Diagnosis not present

## 2016-09-06 DIAGNOSIS — M779 Enthesopathy, unspecified: Secondary | ICD-10-CM

## 2016-09-06 NOTE — Progress Notes (Signed)
   Subjective:    Patient ID: Levora AngelMargaret D Cody, female    DOB: April 12, 1930, 83 y.o.   MRN: [id number]  HPI: She presents today with chief complaint of pain to the first metatarsophalangeal joint of the left foot. She states is aching on and off for about a year. Her primary care provider thought that it was just arthritis gave a cortisone shot which helped temporarily and she's had no other treatment.    Review of Systems  Musculoskeletal: Positive for arthralgias.  All other systems reviewed and are negative.      Objective:   Physical Exam: Vital signs are stable alert and oriented 3. Pulses are palpable. Neurologic sensorium is intact. Deep tendon reflexes are intact. Muscle strength +5 over 5 dorsiflexion plantar flexors and inverters everters all into the musculature is intact. She has tenderness on range of motion of the first metatarsophalangeal joint on dorsiflexion she also has a prominent first metatarsal medial cuneiform spur. Radiographs Sharmon Leydenitken today do demonstrate joint space narrowing subcondylar sclerosis dorsal eburnation first metatarsophalangeal joint left foot.        Assessment & Plan:  Hallux limitus capsulitis first metatarsophalangeal joint left.  Plan: Injected the area today with dexamethasone and local anesthetic. I expressed to her that I would reinject again in 4 months if it is necessary. She will notify us at that time.

## 2016-09-16 DIAGNOSIS — M170 Bilateral primary osteoarthritis of knee: Secondary | ICD-10-CM | POA: Insufficient documentation

## 2017-01-19 ENCOUNTER — Other Ambulatory Visit: Payer: Self-pay | Admitting: Family Medicine

## 2017-01-19 DIAGNOSIS — R1314 Dysphagia, pharyngoesophageal phase: Secondary | ICD-10-CM

## 2017-01-19 DIAGNOSIS — R1013 Epigastric pain: Secondary | ICD-10-CM

## 2017-01-24 ENCOUNTER — Ambulatory Visit
Admission: RE | Admit: 2017-01-24 | Discharge: 2017-01-24 | Disposition: A | Discharge status: Home / Self Care | Payer: Medicare Other | Source: Ambulatory Visit | Attending: Family Medicine | Admitting: Family Medicine

## 2017-01-24 DIAGNOSIS — R1314 Dysphagia, pharyngoesophageal phase: Secondary | ICD-10-CM | POA: Diagnosis present

## 2017-01-24 DIAGNOSIS — K824 Cholesterolosis of gallbladder: Secondary | ICD-10-CM | POA: Insufficient documentation

## 2017-01-24 DIAGNOSIS — R1013 Epigastric pain: Secondary | ICD-10-CM | POA: Diagnosis present

## 2017-06-03 ENCOUNTER — Other Ambulatory Visit: Payer: Self-pay

## 2017-06-03 ENCOUNTER — Ambulatory Visit
Admission: EM | Admit: 2017-06-03 | Discharge: 2017-06-03 | Disposition: A | Discharge status: Home / Self Care | Payer: Medicare Other | Attending: Family Medicine | Admitting: Family Medicine

## 2017-06-03 ENCOUNTER — Ambulatory Visit (INDEPENDENT_AMBULATORY_CARE_PROVIDER_SITE_OTHER): Payer: Medicare Other

## 2017-06-03 ENCOUNTER — Encounter: Payer: Self-pay | Admitting: *Deleted

## 2017-06-03 DIAGNOSIS — S300XXA Contusion of lower back and pelvis, initial encounter: Secondary | ICD-10-CM | POA: Diagnosis not present

## 2017-06-03 DIAGNOSIS — W19XXXA Unspecified fall, initial encounter: Secondary | ICD-10-CM | POA: Diagnosis not present

## 2017-06-03 DIAGNOSIS — S32501A Unspecified fracture of right pubis, initial encounter for closed fracture: Secondary | ICD-10-CM | POA: Diagnosis not present

## 2017-06-03 MED ORDER — MELOXICAM 7.5 MG PO TABS: 7.5000 mg | ORAL_TABLET | Freq: Every day | ORAL | 0 refills | Status: DC

## 2017-06-03 NOTE — ED Triage Notes (Signed)
PAtient fell when she missed a chair 1 week ago injuring her lower back. Back pain and neck pain persist since the fall. Pain occurs when getting up or sitting down.

## 2017-06-03 NOTE — Discharge Instructions (Signed)
Remain active.  May use ice on the area 20 minutes out of every 2 hours 4-5 times daily for pain control.

## 2017-06-03 NOTE — ED Provider Notes (Signed)
[facility]    CSN: [id number] Arrival date & time: 06/03/17  1229     History   Chief Complaint Chief Complaint  Patient presents with  . Back Pain  . Fall    HPI Tina AMBROSIUS is a 84 y.o. female.   HPI  84 year old female presents with coccyx pain.  She states that she missed a chair when she was attempting to sit down 1 week ago.  She fell onto her buttocks and her left side.  She injured her hand that has improved.  It was black and blue at first.  She has not found any tender areas in her back or her coccyx area but states that whenever she stands up or sits down the pain is percieved.  She is able to walk without difficulty if she walks slowly.  When she sits still she has no problems.  She has had no back pain she has had no incontinence constipation or belly pain. She did Not strike her head or lose consciousness.      Past Medical History:  Diagnosis Date  . Depression   . GERD (gastroesophageal reflux disease)   . H/O aortic valve replacement   . Hypercholesteremia   . Hypertension   . Myocardial infarct [facility])     Patient Active Problem List   Diagnosis Date Noted  . Chest pain 12/04/2014    Past Surgical History:  Procedure Laterality Date  . CARDIAC VALVE REPLACEMENT      OB History    No data available       Home Medications    Prior to Admission medications   Medication Sig Start Date End Date Taking? Authorizing Provider  aspirin 81 MG tablet Take 81 mg by mouth daily.   Yes [provider]  furosemide (LASIX) 20 MG tablet Take 10 mg by mouth every other day.   Yes [provider]  losartan (COZAAR) 25 MG tablet Take 25 mg by mouth daily.   Yes [provider]  montelukast (SINGULAIR) 10 MG tablet Take 10 mg by mouth at bedtime.   Yes [provider]  potassium chloride (K-DUR,KLOR-CON) 10 MEQ tablet Take 10 mEq by mouth 2 (two) times daily.   Yes [provider]  pravastatin  (PRAVACHOL) 10 MG tablet Take 10 mg by mouth daily.   Yes [provider]  ranitidine (ZANTAC) 75 MG tablet Take 75 mg by mouth as needed for heartburn.   Yes [provider]  hydrOXYzine (ATARAX/VISTARIL) 25 MG tablet Take 25 mg by mouth every 6 (six) hours as needed.    [provider]  meloxicam (MOBIC) 7.5 MG tablet Take 1 tablet (7.5 mg total) by mouth daily. 06/03/17   Lutricia Feil, PA-C    Family History Family History  Problem Relation Age of Onset  . Liver cancer Mother   . Colon cancer Father     Social History Social History   Tobacco Use  . Smoking status: Never Smoker  . Smokeless tobacco: Never Used  Substance Use Topics  . Alcohol use: No  . Drug use: No     Allergies   Alendronate   Review of Systems Review of Systems  Constitutional: Positive for activity change. Negative for appetite change, chills, fatigue and fever.  All other systems reviewed and are negative.    Physical Exam Triage Vital Signs ED Triage Vitals  Enc Vitals Group     BP 06/03/17 1301 140/60  Pulse Rate 06/03/17 1301 73     Resp 06/03/17 1301 16     Temp 06/03/17 1301 97.7 F (36.5 C)     Temp Source 06/03/17 1301 Oral     SpO2 06/03/17 1301 98 %     Weight 06/03/17 1305 131 lb (59.4 kg)     Height 06/03/17 1305 5\' 5"  (1.651 m)     Head Circumference --      Peak Flow --      Pain Score 06/03/17 1302 0     Pain Loc --      Pain Edu? --      Excl. in GC? --    No data found.  Updated Vital Signs BP 140/60 (BP Location: Left Arm)   Pulse 73   Temp 97.7 F (36.5 C) (Oral)   Resp 16   Ht 5\' 5"  (1.651 m)   Wt 131 lb (59.4 kg)   SpO2 98%   BMI 21.80 kg/m   Visual Acuity Right Eye Distance:   Left Eye Distance:   Bilateral Distance:    Right Eye Near:   Left Eye Near:    Bilateral Near:     Physical Exam  Constitutional: She is oriented to person, place, and time. She appears well-developed and well-nourished. No distress.    HENT:  Head: Normocephalic and atraumatic.  Eyes: Pupils are equal, round, and reactive to light. Right eye exhibits no discharge. Left eye exhibits no discharge.  Neck: Normal range of motion. Neck supple.  Pulmonary/Chest: Effort normal and breath sounds normal.  Musculoskeletal: Normal range of motion. She exhibits no tenderness.  Neurological: She is alert and oriented to person, place, and time.  Skin: Skin is warm and dry. She is not diaphoretic.  Psychiatric: She has a normal mood and affect. Her behavior is normal. Judgment and thought content normal.  Nursing note and vitals reviewed.    UC Treatments / Results  Labs (all labs ordered are listed, but only abnormal results are displayed) Labs Reviewed - No data to display  EKG  EKG Interpretation None       Radiology Dg Sacrum/coccyx  Result Date: 06/03/2017 CLINICAL DATA:  Coccygeal pain since the patient suffered a fall 1 week ago when she attempted to sit in a chair but missed. Initial encounter. EXAM: SACRUM AND COCCYX - 2+ VIEW COMPARISON:  None. FINDINGS: No acute left pubic fracture is seen. Remote left pubic bone fracture is identified. Bones are osteopenic. Degenerative disease of the lower lumbar spine and about the SI joints is noted. IMPRESSION: No acute abnormality. Remote left pubic bone fracture. Lumbar and bilateral SI joint degenerative change. Osteopenia. Electronically Signed   By: Drusilla Kannerhomas  Dalessio M.D.   On: 06/03/2017 14:04    Procedures Procedures (including critical care time)  Medications Ordered in UC Medications - No data to display   Initial Impression / Assessment and Plan / UC Course  I have reviewed the triage vital signs and the nursing notes.  Pertinent labs & imaging results that were available during my care of the patient were reviewed by me and considered in my medical decision making (see chart for details).     Plan: 1. Test/x-ray results and diagnosis reviewed with  patient 2. rx as per orders; risks, benefits, potential side effects reviewed with patient 3. Recommend supportive treatment with being active.  Ice as necessary 20 minutes out of every 2 hours 4-5 times daily.  Will provide Mobic 7/2 mg for pain control  at her request.  Commend following up with Dr. Quillian Quince her primary care physician in a week 4. F/u prn if symptoms worsen or don't improve   Final Clinical Impressions(s) / UC Diagnoses   Final diagnoses:  Contusion of coccyx, initial encounter    ED Discharge Orders        Ordered    meloxicam (MOBIC) 7.5 MG tablet  Daily     06/03/17 1422       Controlled Substance Prescriptions [place] Controlled Substance Registry consulted? Not Applicable   Lutricia Feil, PA-C 06/03/17 1427

## 2018-03-05 ENCOUNTER — Other Ambulatory Visit: Payer: Self-pay

## 2018-03-05 ENCOUNTER — Encounter: Payer: Self-pay | Admitting: Gynecology

## 2018-03-05 ENCOUNTER — Ambulatory Visit
Admission: EM | Admit: 2018-03-05 | Discharge: 2018-03-05 | Disposition: A | Discharge status: Home / Self Care | Payer: Medicare Other | Attending: Emergency Medicine | Admitting: Emergency Medicine

## 2018-03-05 DIAGNOSIS — S80821A Blister (nonthermal), right lower leg, initial encounter: Secondary | ICD-10-CM | POA: Diagnosis not present

## 2018-03-05 DIAGNOSIS — T148XXA Other injury of unspecified body region, initial encounter: Secondary | ICD-10-CM

## 2018-03-05 MED ORDER — MUPIROCIN 2 % EX OINT: 1.0000 "application " | TOPICAL_OINTMENT | Freq: Three times a day (TID) | CUTANEOUS | 0 refills | Status: AC

## 2018-03-05 NOTE — ED Triage Notes (Signed)
Patient c/o blister/ bruise on her right leg. Per patient right leg redness and swollen.

## 2018-03-05 NOTE — Discharge Instructions (Addendum)
Keep this clean and dry for the next 24 hours.  You may apply Bactroban to this two or 3 times a day to help prevent secondary infection.  Follow-up with your doctor or return here if it does appear to become infected.

## 2018-03-05 NOTE — ED Provider Notes (Signed)
HPI  SUBJECTIVE:  Tina Mclaughlin is a 84 y.o. female who presents with a bruise to her medial right lower extremity starting several days ago that turned into a blood blister today.  She denies any pain.  She reports surrounding erythema starting today and increased temperature.  No fevers chills, body aches.  She does not recall any trauma to the area.  There are no aggravating or alleviating factors.  She has not tried anything for this.  She has a past medical history of MI, mitral valve replacement, is on aspirin.  No history of diabetes, hypertension, peripheral arterial which car of never got her that disease, peripheral vascular disease.  [id number]:[id number], Doreene Nest, MD Janace Aris  Past Medical History:  Diagnosis Date  . Depression   . GERD (gastroesophageal reflux disease)   . H/O aortic valve replacement   . Hypercholesteremia   . Hypertension   . Myocardial infarct [facility])     Past Surgical History:  Procedure Laterality Date  . CARDIAC VALVE REPLACEMENT      Family History  Problem Relation Age of Onset  . Liver cancer Mother   . Colon cancer Father     Social History   Tobacco Use  . Smoking status: Never Smoker  . Smokeless tobacco: Never Used  Substance Use Topics  . Alcohol use: No  . Drug use: No    No current facility-administered medications for this encounter.   Current Outpatient Medications:  .  aspirin 81 MG tablet, Take 81 mg by mouth daily., Disp: , Rfl:  .  furosemide (LASIX) 20 MG tablet, Take 10 mg by mouth every other day., Disp: , Rfl:  .  hydrOXYzine (ATARAX/VISTARIL) 25 MG tablet, Take 25 mg by mouth every 6 (six) hours as needed., Disp: , Rfl:  .  losartan (COZAAR) 25 MG tablet, Take 25 mg by mouth daily., Disp: , Rfl:  .  montelukast (SINGULAIR) 10 MG tablet, Take 10 mg by mouth at bedtime., Disp: , Rfl:  .  potassium chloride (K-DUR,KLOR-CON) 10 MEQ tablet, Take 10 mEq by mouth 2 (two) times daily., Disp: , Rfl:  .  pravastatin (PRAVACHOL) 10 MG  tablet, Take 10 mg by mouth daily., Disp: , Rfl:  .  ranitidine (ZANTAC) 75 MG tablet, Take 75 mg by mouth as needed for heartburn., Disp: , Rfl:  .  mupirocin ointment (BACTROBAN) 2 %, Apply 1 application topically 3 (three) times daily., Disp: 22 g, Rfl: 0  Allergies  Allergen Reactions  . Alendronate Rash     ROS  As noted in HPI.   Physical Exam  BP 115/67 (BP Location: Left Arm)   Pulse 76   Temp 98 F (36.7 C) (Oral)   Wt 59 kg   SpO2 99%   BMI 21.63 kg/m   Constitutional: Well developed, well nourished, no acute distress Eyes:  EOMI, conjunctiva normal bilaterally HENT: Normocephalic, atraumatic,mucus membranes moist Respiratory: Normal inspiratory effort Cardiovascular: Normal rate GI: nondistended skin: No rash, skin intact Musculoskeletal: Nontender area of brown pigmentation measuring 8.5 x 9 cm.  Outlined this with a dotted line.   blood blister with a small area of nontender erythema measuring 3x 3.5 cm outlined this with a solid line.  No induration, increased temperature.     Neurologic: Alert & oriented x 3, no focal neuro deficits Psychiatric: Speech and behavior appropriate   ED Course   Medications - No data to display  No orders of the defined types were placed in this encounter.  No results found for this or any previous visit (from the past 24 hour(s)). No results found.  ED Clinical Impression  Blood blister   ED Assessment/Plan  Yes you do will drain this as I feel that this will rupture, and I would prefer to have it ruptured under controlled circumstances.  Procedure note: Cleaned area with alcohol and chlorhexidine.  Using sterile technique, used an 18-gauge needle made a single stab incision, drained blood.  Left the skin intact as a biologic dressing.  Bacitracin, sterile dressing placed.  Patient tolerated procedure well.  Home with Bactroban to prevent secondary infection. Does not appear to be a cellulitis today, so  withholding oral antibiotics.  Follow-up here or with primary care physician as needed if this appears to become infected.  Discussed medical decision making, treatment plan and plan for follow-up with patient.  She agrees with plan.  Meds ordered this encounter  Medications  . mupirocin ointment (BACTROBAN) 2 %    Sig: Apply 1 application topically 3 (three) times daily.    Dispense:  22 g    Refill:  0    *This clinic note was created using Scientist, clinical (histocompatibility and immunogenetics)Dragon dictation software. Therefore, there may be occasional mistakes despite careful proofreading.   ?    Domenick GongMortenson, Koryn Charlot, MD 03/06/18 1640

## 2019-05-16 ENCOUNTER — Other Ambulatory Visit
Admission: RE | Admit: 2019-05-16 | Discharge: 2019-05-16 | Disposition: A | Discharge status: Home / Self Care | Payer: Medicare PPO | Source: Ambulatory Visit | Attending: Student | Admitting: Student

## 2019-05-16 DIAGNOSIS — Z5181 Encounter for therapeutic drug level monitoring: Secondary | ICD-10-CM | POA: Diagnosis present

## 2019-05-16 DIAGNOSIS — Z79899 Other long term (current) drug therapy: Secondary | ICD-10-CM | POA: Diagnosis not present

## 2019-05-16 LAB — BRAIN NATRIURETIC PEPTIDE: B Natriuretic Peptide: 137 pg/mL — ABNORMAL HIGH (ref 0.0–100.0)

## 2019-06-14 DIAGNOSIS — Z953 Presence of xenogenic heart valve: Secondary | ICD-10-CM | POA: Insufficient documentation

## 2019-06-14 DIAGNOSIS — G4709 Other insomnia: Secondary | ICD-10-CM | POA: Insufficient documentation

## 2019-06-23 ENCOUNTER — Encounter: Payer: Self-pay | Admitting: Emergency Medicine

## 2019-06-23 ENCOUNTER — Other Ambulatory Visit: Payer: Self-pay

## 2019-06-23 ENCOUNTER — Ambulatory Visit
Admission: EM | Admit: 2019-06-23 | Discharge: 2019-06-23 | Disposition: A | Discharge status: Home / Self Care | Payer: Medicare PPO | Attending: Family Medicine | Admitting: Family Medicine

## 2019-06-23 ENCOUNTER — Ambulatory Visit (INDEPENDENT_AMBULATORY_CARE_PROVIDER_SITE_OTHER): Payer: Medicare PPO

## 2019-06-23 DIAGNOSIS — W19XXXA Unspecified fall, initial encounter: Secondary | ICD-10-CM

## 2019-06-23 DIAGNOSIS — S20212A Contusion of left front wall of thorax, initial encounter: Secondary | ICD-10-CM | POA: Diagnosis not present

## 2019-06-23 DIAGNOSIS — M940 Chondrocostal junction syndrome [Tietze]: Secondary | ICD-10-CM | POA: Diagnosis not present

## 2019-06-23 DIAGNOSIS — R0789 Other chest pain: Secondary | ICD-10-CM | POA: Diagnosis not present

## 2019-06-23 MED ORDER — HYDROCODONE-ACETAMINOPHEN 5-325 MG PO TABS: ORAL_TABLET | ORAL | 0 refills | Status: AC

## 2019-06-23 NOTE — ED Provider Notes (Signed)
[facility]    CSN: [id number] Arrival date & time: 06/23/19  1446      History   Chief Complaint Chief Complaint  Patient presents with  . Fall    DOI 06/21/19  . Breast Pain    left    HPI Tina Mclaughlin is a 86 y.o. female.   86 yo female with a c/o pain to her left chest "behind the breast" since falling 2 days ago when she stepped into a hole outside.  States she landed on her left side. Denies hitting her head or loss of consciousness. States pain is worse with deep breaths. Denies any fevers, chills, shortness of breath.    Fall    Past Medical History:  Diagnosis Date  . Depression   . GERD (gastroesophageal reflux disease)   . H/O aortic valve replacement   . Hypercholesteremia   . Hypertension   . Myocardial infarct [facility])     Patient Active Problem List   Diagnosis Date Noted  . Chest pain 12/04/2014    Past Surgical History:  Procedure Laterality Date  . CARDIAC VALVE REPLACEMENT      OB History   No obstetric history on file.      Home Medications    Prior to Admission medications   Medication Sig Start Date End Date Taking? Authorizing Provider  aspirin 81 MG tablet Take 81 mg by mouth daily.   Yes [provider]  furosemide (LASIX) 20 MG tablet Take 10 mg by mouth every other day.   Yes [provider]  hydrOXYzine (ATARAX/VISTARIL) 25 MG tablet Take 25 mg by mouth every 6 (six) hours as needed.   Yes [provider]  losartan (COZAAR) 25 MG tablet Take 25 mg by mouth daily.   Yes [provider]  montelukast (SINGULAIR) 10 MG tablet Take 10 mg by mouth at bedtime.   Yes [provider]  potassium chloride (K-DUR,KLOR-CON) 10 MEQ tablet Take 10 mEq by mouth 2 (two) times daily.   Yes [provider]  pravastatin (PRAVACHOL) 10 MG tablet Take 10 mg by mouth daily.   Yes [provider]  ranitidine (ZANTAC) 75 MG tablet Take 75 mg by mouth as needed for heartburn.    Yes [provider]  HYDROcodone-acetaminophen (NORCO/VICODIN) 5-325 MG tablet 1-2 tabs po bid prn severe pain 06/23/19   Payton Mccallum, MD  mupirocin ointment (BACTROBAN) 2 % Apply 1 application topically 3 (three) times daily. 03/05/18   Domenick Gong, MD    Family History Family History  Problem Relation Age of Onset  . Liver cancer Mother   . Colon cancer Father     Social History Social History   Tobacco Use  . Smoking status: Never Smoker  . Smokeless tobacco: Never Used  Substance Use Topics  . Alcohol use: No  . Drug use: No     Allergies   Alendronate   Review of Systems Review of Systems   Physical Exam Triage Vital Signs ED Triage Vitals  Enc Vitals Group     BP 06/23/19 1458 (!) 146/64     Pulse Rate 06/23/19 1458 69     Resp 06/23/19 1458 18     Temp 06/23/19 1458 (!) 97.5 F (36.4 C)     Temp Source 06/23/19 1458 Oral     SpO2 06/23/19 1458 99 %     Weight 06/23/19 1459 134 lb (60.8 kg)     Height 06/23/19 1459 5\' 5"  (1.651  m)     Head Circumference --      Peak Flow --      Pain Score 06/23/19 1458 9     Pain Loc --      Pain Edu? --      Excl. in GC? --    No data found.  Updated Vital Signs BP (!) 146/64 (BP Location: Left Arm)   Pulse 69   Temp (!) 97.5 F (36.4 C) (Oral)   Resp 18   Ht 5\' 5"  (1.651 m)   Wt 60.8 kg   SpO2 99%   BMI 22.30 kg/m   Visual Acuity Right Eye Distance:   Left Eye Distance:   Bilateral Distance:    Right Eye Near:   Left Eye Near:    Bilateral Near:     Physical Exam Vitals and nursing note reviewed.  Constitutional:      General: She is not in acute distress.    Appearance: She is not toxic-appearing or diaphoretic.  Cardiovascular:     Rate and Rhythm: Normal rate.     Heart sounds: Normal heart sounds.  Pulmonary:     Effort: Pulmonary effort is normal. No respiratory distress.     Breath sounds: Normal breath sounds. No stridor. No wheezing, rhonchi or rales.  Chest:      Chest wall: Tenderness (left middle ribs) present.  Neurological:     Mental Status: She is alert.      UC Treatments / Results  Labs (all labs ordered are listed, but only abnormal results are displayed) Labs Reviewed - No data to display  EKG   Radiology DG Ribs Unilateral W/Chest Left  Result Date: 06/23/2019 CLINICAL DATA:  Left-sided chest pain after fall 06/21/2019 landing on that side. Stepped in a hole. EXAM: LEFT RIBS AND CHEST - 3+ VIEW COMPARISON:  Radiograph 12/07/2014. chest CT 01/08/2011 FINDINGS: Remote healed fractures of left lateral fourth, fifth, and sixth ribs. No acute fracture visualized. There is no evidence of pneumothorax or pleural effusion. Minimal patchy opacity at the left lung base, likely atelectasis or scarring. Post median sternotomy with prosthetic valve. Aortic atherosclerosis. Heart size and mediastinal contours are within normal limits. IMPRESSION: 1. Remote left lateral fourth, fifth, and sixth rib fractures. No acute rib fracture visualized. 2. Minimal patchy opacity at the left lung base, likely atelectasis or scarring. 3. Post median sternotomy with prosthetic cardiac valve. Aortic Atherosclerosis (ICD10-I70.0). Electronically Signed   By: 01/10/2011 M.D.   On: 06/23/2019 15:24    Procedures Procedures (including critical care time)  Medications Ordered in UC Medications - No data to display  Initial Impression / Assessment and Plan / UC Course  I have reviewed the triage vital signs and the nursing notes.  Pertinent labs & imaging results that were available during my care of the patient were reviewed by me and considered in my medical decision making (see chart for details).      Final Clinical Impressions(s) / UC Diagnoses   Final diagnoses:  Contusion of left chest wall, initial encounter  Costochondritis  Fall, initial encounter     Discharge Instructions     Rest, ice/heat, tylenol    ED Prescriptions    Medication  Sig Dispense Auth. Provider   HYDROcodone-acetaminophen (NORCO/VICODIN) 5-325 MG tablet 1-2 tabs po bid prn severe pain 10 tablet 08/23/2019, MD      1. Chest/rib x-ray (reviewed by me)  2. X-ray results (no acute findings) and diagnosis reviewed with patient  3. rx as per orders above; reviewed possible side effects, interactions, risks and benefits  4. Recommend supportive treatment with rest, ice/heat, otc tylenol 5. Follow-up prn if symptoms worsen or don't improve   I have reviewed the PDMP during this encounter.   Norval Gable, MD 06/23/19 [phone]

## 2019-06-23 NOTE — ED Triage Notes (Addendum)
Patient in today c/o pain behind her left breast after falling on 06/21/19 landing on that side. Patient states she was outside and stepped in a hole.

## 2019-06-23 NOTE — Discharge Instructions (Signed)
Rest, ice/heat, tylenol °

## 2020-10-14 ENCOUNTER — Other Ambulatory Visit: Payer: Self-pay

## 2020-10-14 ENCOUNTER — Ambulatory Visit (INDEPENDENT_AMBULATORY_CARE_PROVIDER_SITE_OTHER): Payer: Medicare PPO

## 2020-10-14 ENCOUNTER — Ambulatory Visit
Admission: EM | Admit: 2020-10-14 | Discharge: 2020-10-14 | Disposition: A | Discharge status: Home / Self Care | Payer: Medicare PPO | Attending: Sports Medicine | Admitting: Sports Medicine

## 2020-10-14 DIAGNOSIS — S50312A Abrasion of left elbow, initial encounter: Secondary | ICD-10-CM

## 2020-10-14 DIAGNOSIS — M79645 Pain in left finger(s): Secondary | ICD-10-CM | POA: Diagnosis not present

## 2020-10-14 DIAGNOSIS — S60512A Abrasion of left hand, initial encounter: Secondary | ICD-10-CM | POA: Diagnosis not present

## 2020-10-14 DIAGNOSIS — W19XXXA Unspecified fall, initial encounter: Secondary | ICD-10-CM

## 2020-10-14 DIAGNOSIS — R102 Pelvic and perineal pain: Secondary | ICD-10-CM

## 2020-10-14 DIAGNOSIS — M79642 Pain in left hand: Secondary | ICD-10-CM

## 2020-10-14 DIAGNOSIS — S300XXA Contusion of lower back and pelvis, initial encounter: Secondary | ICD-10-CM | POA: Diagnosis not present
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
## 2020-10-14 NOTE — Discharge Instructions (Addendum)
As we discussed, your x-rays do not show a fracture of the hand, finger, or the tailbone. Please consider buying a hemorrhoid ring that she can sit on to take pressure off of the tailbone. Please keep the wounds clean and dry and dressed them as necessary. Please see your primary care provider if symptoms persist. I did include the name and number of an orthopedic group should your symptoms worsen. You can ice the area but please do not use heat. Over-the-counter meds such as Tylenol Motrin or ibuprofen as needed. If symptoms worsen and you cannot get into see anybody please go to the ER.

## 2020-10-17 NOTE — ED Provider Notes (Signed)
[facility]    CSN: [id number] Arrival date & time: 10/14/20  1025      History   Chief Complaint Chief Complaint  Patient presents with   Fall   Abrasion   Tailbone Pain   Hand Pain    left    HPI Tina Mclaughlin is a 87 y.o. female.   Patient is a pleasant 87 year old female who presents with her daughter for evaluation of a fall that occurred yesterday.  Date of injury was 10/13/2020.  She fell in the parking lot at [employer]  She was between cars and fell onto the asphalt.  She is having pain in her low back low down over the coccyx and sacrum.  She is also having some mild discomfort in her left elbow and her left hand where she sustained some cuts and abrasions.  Her daughter dressed the abrasions but she is complaining of more discomfort into her finger today.  The main concern now is her low back and her third finger.  No fever shakes chills.  No redness or warmth around any of the cuts.  She denies any incontinence of bowel or bladder.  No saddle anesthesia.  No numbness or tingling or foot drop.  No evidence on history of any disc issue.  No other issues or problems are offered on history.   Past Medical History:  Diagnosis Date   Depression    GERD (gastroesophageal reflux disease)    H/O aortic valve replacement    Hypercholesteremia    Hypertension    Myocardial infarct [facility](HCC)     Patient Active Problem List   Diagnosis Date Noted   Chest pain 12/04/2014    Past Surgical History:  Procedure Laterality Date   CARDIAC VALVE REPLACEMENT      OB History   No obstetric history on file.      Home Medications    Prior to Admission medications   Medication Sig Start Date End Date Taking? Authorizing Provider  aspirin 81 MG tablet Take 81 mg by mouth daily.   Yes [provider]  esomeprazole (NEXIUM) 40 MG capsule Take 40 mg by mouth daily. 10/10/20  Yes [provider]  montelukast (SINGULAIR) 10 MG tablet Take 10 mg by mouth  at bedtime.   Yes [provider]  potassium chloride (K-DUR,KLOR-CON) 10 MEQ tablet Take 10 mEq by mouth 2 (two) times daily.   Yes [provider]  pravastatin (PRAVACHOL) 10 MG tablet Take 10 mg by mouth daily.   Yes [provider]  torsemide (DEMADEX) 20 MG tablet Take 20 mg by mouth daily. 10/09/20  Yes [provider]  furosemide (LASIX) 20 MG tablet Take 10 mg by mouth every other day.    [provider]  HYDROcodone-acetaminophen (NORCO/VICODIN) 5-325 MG tablet 1-2 tabs po bid prn severe pain 06/23/19   Payton Mccallumonty, [place], MD  HYDROcodone-acetaminophen (NORCO/VICODIN) 5-325 MG tablet 1-2 tabs po bid prn 06/23/19   Payton Mccallumonty, [place], MD  hydrOXYzine (ATARAX/VISTARIL) 25 MG tablet Take 25 mg by mouth every 6 (six) hours as needed.    [provider]  losartan (COZAAR) 25 MG tablet Take 25 mg by mouth daily.    [provider]  mupirocin ointment (BACTROBAN) 2 % Apply 1 application topically 3 (three) times daily. 03/05/18   Domenick GongMortenson, Ashley, MD  ranitidine (ZANTAC) 75 MG tablet Take 75 mg by mouth as needed for heartburn.    [provider]    Family History Family  History  Problem Relation Age of Onset   Liver cancer Mother    Colon cancer Father     Social History Social History   Tobacco Use   Smoking status: Never   Smokeless tobacco: Never  Vaping Use   Vaping Use: Never used  Substance Use Topics   Alcohol use: No   Drug use: No     Allergies   Alendronate   Review of Systems Review of Systems  Constitutional:  Positive for activity change. Negative for appetite change, chills, diaphoresis, fatigue and fever.  HENT:  Negative for congestion, ear pain, postnasal drip, rhinorrhea, sinus pressure, sinus pain, sneezing and sore throat.   Eyes:  Negative for pain.  Respiratory:  Negative for cough, chest tightness and shortness of breath.   Cardiovascular:  Negative for chest pain and palpitations.   Gastrointestinal:  Negative for abdominal pain, diarrhea, nausea and vomiting.  Genitourinary:  Negative for dysuria.  Musculoskeletal:  Positive for arthralgias and back pain. Negative for myalgias, neck pain and neck stiffness.  Skin:  Positive for color change and wound. Negative for pallor and rash.  Neurological:  Negative for dizziness, seizures, syncope, light-headedness, numbness and headaches.  All other systems reviewed and are negative.   Physical Exam Triage Vital Signs ED Triage Vitals  Enc Vitals Group     BP 10/14/20 1128 (!) 141/65     Pulse Rate 10/14/20 1128 72     Resp 10/14/20 1128 18     Temp 10/14/20 1128 98.2 F (36.8 C)     Temp Source 10/14/20 1128 Oral     SpO2 10/14/20 1128 100 %     Weight 10/14/20 1124 130 lb (59 kg)     Height 10/14/20 1124 5\' 5"  (1.651 m)     Head Circumference --      Peak Flow --      Pain Score 10/14/20 1124 4     Pain Loc --      Pain Edu? --      Excl. in GC? --    No data found.  Updated Vital Signs BP (!) 141/65 (BP Location: Right Arm)   Pulse 72   Temp 98.2 F (36.8 C) (Oral)   Resp 18   Ht 5\' 5"  (1.651 m)   Wt 59 kg   SpO2 100%   BMI 21.63 kg/m   Visual Acuity Right Eye Distance:   Left Eye Distance:   Bilateral Distance:    Right Eye Near:   Left Eye Near:    Bilateral Near:     Physical Exam Vitals and nursing note reviewed.  Constitutional:      General: She is not in acute distress.    Appearance: Normal appearance. She is not ill-appearing, toxic-appearing or diaphoretic.  HENT:     Head: Normocephalic and atraumatic.     Nose: Nose normal.     Mouth/Throat:     Mouth: Mucous membranes are moist.  Eyes:     General: No scleral icterus.    Extraocular Movements: Extraocular movements intact.     Conjunctiva/sclera: Conjunctivae normal.     Pupils: Pupils are equal, round, and reactive to light.  Cardiovascular:     Rate and Rhythm: Normal rate and regular rhythm.     Pulses: Normal  pulses.     Heart sounds: Normal heart sounds. No murmur heard.   No friction rub. No gallop.  Pulmonary:     Effort: Pulmonary effort is normal.  Breath sounds: Normal breath sounds. No stridor. No wheezing, rhonchi or rales.  Musculoskeletal:     Right elbow: Normal.     Left elbow: Laceration present. No swelling or deformity. Normal range of motion. Tenderness present.     Right hand: Normal.     Left hand: Laceration and tenderness present. No swelling or deformity. Decreased range of motion.     Cervical back: Normal range of motion and neck supple.     Lumbar back: Signs of trauma, tenderness and bony tenderness present. No edema or spasms. Decreased range of motion. Negative right straight leg raise test and negative left straight leg raise test.  Skin:    General: Skin is warm and dry.     Capillary Refill: Capillary refill takes less than 2 seconds.     Coloration: Skin is not jaundiced.     Findings: Abrasion, signs of injury, laceration and wound present. No abscess, erythema or rash.     Comments: Abrasion on the left elbow with a skin tear.  No active infection or drainage.  Is not actively bleeding.  It does not need suturing.  Also with multiple abrasions and skin tears on the fingers of the left hand most notably at the third finger.  She has some underlying advanced OA and decreased range of motion that is chronic.  There are some minimal tenderness to palpation.  No open wounds that are infected.  No drainage.  No erythema.  Neurological:     General: No focal deficit present.     Mental Status: She is alert and oriented to person, place, and time.     GCS: GCS eye subscore is 4. GCS verbal subscore is 5. GCS motor subscore is 6.     Cranial Nerves: Cranial nerves are intact.     Sensory: Sensation is intact.     Motor: Motor function is intact.     Coordination: Coordination is intact.     UC Treatments / Results  Labs (all labs ordered are listed, but only  abnormal results are displayed) Labs Reviewed - No data to display  EKG   Radiology EXAM: SACRUM AND COCCYX - 2+ VIEW   COMPARISON:  06/03/2017   FINDINGS: No evidence of acute fracture of the sacrum or coccyx. Bilateral sacroiliac joints are intact without diastasis. Bones are demineralized. 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 Lower lumbar spondylosis.   IMPRESSION: 1. No evidence of acute fracture of the sacrum or coccyx. 2. 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  EXAM: LEFT MIDDLE FINGER 2+V   COMPARISON:  None.   FINDINGS: No acute abnormality is identified. Scattered interphalangeal joint osteoarthritis appears worst at the DIP joint of long finger. No radiopaque foreign body or soft tissue gas.   IMPRESSION: No acute abnormality.  Procedures Procedures (including critical care time)  Medications Ordered in UC Medications - No data to display  Initial Impression / Assessment and Plan / UC Course  I have reviewed the triage vital signs and the nursing notes.  Pertinent labs & imaging results that were available during my care of the patient were reviewed by me and considered in my medical decision making (see chart for details).  Clinical impression: 1.  Fall in a parking lot yesterday 2.  Contusion to the coccyx and sacrum 3.  Abrasion to left elbow 4.  Abrasion to left hand with hand pain.  Treatment plan: 1.  The findings and treatment plan  were discussed in detail with the patient and her daughter.  All parties were in agreement voiced verbal understanding. 2.  Recommended getting x-rays.  The results are above.  No evidence of acute fracture.  There are some chronic changes and some advanced osteoarthritis most notably at the DIP joint of the third finger.  No acute osseous findings 3.  Explained to the patient and the daughter that I want them just keep the wounds clean and dry.  They  were dressed by the clinical staff today.  There is any concern about infection redness or warmth he should seek out medical care. 4.  Regarding the contusion to the sacrum and coccyx, I recommended purchasing a donut or hemorrhoid ring for her to sit on to take some of the pressure off the area where she is having pain most notably at the tailbone.  They voiced verbal understanding. 5.  I encouraged him to get another primary care physician for follow-up visit to ensure that she is improving. 6.  Provided the information for one of the local orthopedic groups and they can contact them if they are need of their services. 7.  Encouraged them to use some ice over those areas and no heat. 8.  They can use over-the-counter meds such as Tylenol or Motrin or ibuprofen for any discomfort. 9.  If symptoms were to worsen in any way and they cannot get into the PCP or orthopedics they should go to the emergency room.  They voiced verbal understanding. 10.  Patient was discharged in stable condition and will follow-up here as needed.  Greater than 50 minutes was spent with the patient, obtaining history, doing a review of systems, doing a thorough physical exam, reviewing the chart in detail, ordering tests, interpreting tests, coming up with a treatment plan, explaining the findings and appropriate follow-up.  All questions were encouraged and answered.     Final Clinical Impressions(s) / UC Diagnoses   Final diagnoses:  Fall, initial encounter  Left hand pain  Abrasion of left elbow, initial encounter  Abrasion of left hand, initial encounter  Contusion of coccyx, initial encounter  Contusion of sacrum, initial encounter     Discharge Instructions      As we discussed, your x-rays do not show a fracture of the hand, finger, or the tailbone. Please consider buying a hemorrhoid ring that she can sit on to take pressure off of the tailbone. Please keep the wounds clean and dry and dressed them as  necessary. Please see your primary care provider if symptoms persist. I did include the name and number of an orthopedic group should your symptoms worsen. You can ice the area but please do not use heat. Over-the-counter meds such as Tylenol Motrin or ibuprofen as needed. If symptoms worsen and you cannot get into see anybody please go to the ER.     ED Prescriptions   None    PDMP not reviewed this encounter.   Delton See, MD 10/17/20 1440

## 2020-12-02 DIAGNOSIS — S2242XA Multiple fractures of ribs, left side, initial encounter for closed fracture: Secondary | ICD-10-CM | POA: Insufficient documentation

## 2020-12-02 DIAGNOSIS — R296 Repeated falls: Secondary | ICD-10-CM | POA: Insufficient documentation

## 2022-03-01 ENCOUNTER — Other Ambulatory Visit: Payer: Self-pay | Admitting: Family Medicine

## 2022-03-01 ENCOUNTER — Ambulatory Visit
Admission: RE | Admit: 2022-03-01 | Discharge: 2022-03-01 | Disposition: A | Discharge status: Home / Self Care | Payer: Medicare PPO | Source: Ambulatory Visit | Attending: Family Medicine | Admitting: Family Medicine

## 2022-03-01 ENCOUNTER — Ambulatory Visit
Admission: RE | Admit: 2022-03-01 | Discharge: 2022-03-01 | Disposition: A | Discharge status: Home / Self Care | Payer: Medicare PPO | Attending: Family Medicine | Admitting: Family Medicine

## 2022-03-01 DIAGNOSIS — J189 Pneumonia, unspecified organism: Secondary | ICD-10-CM

## 2022-04-01 ENCOUNTER — Ambulatory Visit
Admission: RE | Admit: 2022-04-01 | Discharge: 2022-04-01 | Disposition: A | Discharge status: Home / Self Care | Payer: Medicare PPO | Attending: Family Medicine | Admitting: Family Medicine

## 2022-04-01 ENCOUNTER — Other Ambulatory Visit: Payer: Self-pay | Admitting: Family Medicine

## 2022-04-01 ENCOUNTER — Ambulatory Visit
Admission: RE | Admit: 2022-04-01 | Discharge: 2022-04-01 | Disposition: A | Discharge status: Home / Self Care | Payer: Medicare PPO | Source: Ambulatory Visit | Attending: Family Medicine | Admitting: Family Medicine

## 2022-04-01 DIAGNOSIS — J189 Pneumonia, unspecified organism: Secondary | ICD-10-CM | POA: Insufficient documentation

## 2023-05-04 ENCOUNTER — Other Ambulatory Visit: Payer: Self-pay | Admitting: Orthopedic Surgery

## 2023-05-04 ENCOUNTER — Encounter: Payer: Self-pay | Admitting: Orthopedic Surgery

## 2023-05-04 DIAGNOSIS — M2392 Unspecified internal derangement of left knee: Secondary | ICD-10-CM

## 2023-05-04 DIAGNOSIS — M1712 Unilateral primary osteoarthritis, left knee: Secondary | ICD-10-CM

## 2023-07-07 DIAGNOSIS — E875 Hyperkalemia: Secondary | ICD-10-CM | POA: Insufficient documentation

## 2023-07-07 DIAGNOSIS — I5032 Chronic diastolic (congestive) heart failure: Secondary | ICD-10-CM | POA: Insufficient documentation

## 2023-07-07 DIAGNOSIS — R9431 Abnormal electrocardiogram [ECG] [EKG]: Secondary | ICD-10-CM | POA: Insufficient documentation

## 2023-07-07 DIAGNOSIS — B965 Pseudomonas (aeruginosa) (mallei) (pseudomallei) as the cause of diseases classified elsewhere: Secondary | ICD-10-CM | POA: Insufficient documentation

## 2023-07-07 DIAGNOSIS — N189 Chronic kidney disease, unspecified: Secondary | ICD-10-CM | POA: Insufficient documentation

## 2023-08-21 ENCOUNTER — Ambulatory Visit
Admission: EM | Admit: 2023-08-21 | Discharge: 2023-08-21 | Disposition: A | Discharge status: Home / Self Care | Attending: Family Medicine | Admitting: Family Medicine

## 2023-08-21 DIAGNOSIS — R21 Rash and other nonspecific skin eruption: Secondary | ICD-10-CM

## 2023-08-21 MED ORDER — FLUCONAZOLE 150 MG PO TABS: 150.0000 mg | ORAL_TABLET | ORAL | 0 refills | Status: AC

## 2023-08-21 MED ORDER — DOXYCYCLINE HYCLATE 100 MG PO CAPS: 100.0000 mg | ORAL_CAPSULE | Freq: Two times a day (BID) | ORAL | 0 refills | Status: AC

## 2023-08-21 MED ORDER — TRIAMCINOLONE ACETONIDE 0.5 % EX OINT: 1.0000 | TOPICAL_OINTMENT | Freq: Two times a day (BID) | CUTANEOUS | 0 refills | Status: AC

## 2023-08-21 NOTE — ED Triage Notes (Signed)
 Patient presents to [facility] for generalized rash. Received steroid shot 05/30 at PCP. No improvement. Treating with benadryl , states it helps.

## 2023-08-21 NOTE — ED Provider Notes (Signed)
 [facility]    CSN: [id number] Arrival date & time: 08/21/23  1005      History   Chief Complaint Chief Complaint  Patient presents with   Rash    HPI Tina Mclaughlin is a 89 y.o. female.   HPI  History provide patient and her female family member Suheily presents for chest rash that is getting worse.  She saw her PCP on Friday who who gave her a shot of steroids.  This is not helping the itching.  The rash is getting worse.  She has a arm wound on the left shoulder after a fall.  This is being treated with bacitracin.  There is been no new products including soaps and detergents.  No eye irritation, sore throat, difficulty breathing, nausea, vomiting or diarrhea.  Denies new belly pain, joint pain and fever.  There has been no medication changes or new supplements.  Denies any new foods or drinks.    Past Medical History:  Diagnosis Date   Depression    GERD (gastroesophageal reflux disease)    H/O aortic valve replacement    Hypercholesteremia    Hypertension    Myocardial infarct [facility])     Patient Active Problem List   Diagnosis Date Noted   CKD (chronic kidney disease) 07/07/2023   Heart failure with recovered ejection fraction (HFrecEF) (HCC) 07/07/2023   Hyperkalemia 07/07/2023   Prolonged QT interval 07/07/2023   Pseudomonas urinary tract infection 07/07/2023   Closed fracture of multiple ribs of left side 12/02/2020   Frequent falls 12/02/2020   History of aortic valve replacement with bioprosthetic valve 06/14/2019   Other insomnia 06/14/2019   Primary osteoarthritis of both knees 09/16/2016   Chest pain 12/04/2014   Acute on chronic systolic heart failure (HCC) 12/08/2013   Coronary atherosclerosis 12/07/2013   Cough 12/07/2013   Essential hypertension 12/07/2013   Hyperlipidemia, unspecified 12/07/2013    Past Surgical History:  Procedure Laterality Date   CARDIAC VALVE REPLACEMENT      OB History   No obstetric history on  file.      Home Medications    Prior to Admission medications   Medication Sig Start Date End Date Taking? Authorizing Provider  doxycycline (VIBRAMYCIN) 100 MG capsule Take 1 capsule (100 mg total) by mouth 2 (two) times daily. 08/21/23  Yes Ambri Miltner, DO  fluconazole (DIFLUCAN) 150 MG tablet Take 1 tablet (150 mg total) by mouth every 3 (three) days for 2 doses. 08/21/23 08/25/23 Yes Rylinn Linzy, DO  triamcinolone  ointment (KENALOG ) 0.5 % Apply 1 Application topically 2 (two) times daily. 08/21/23  Yes Momina Hunton, DO  aspirin  81 MG tablet Take 81 mg by mouth daily.    [provider]  esomeprazole (NEXIUM) 40 MG capsule Take 40 mg by mouth daily. 10/10/20   [provider]  furosemide  (LASIX ) 20 MG tablet Take 10 mg by mouth every other day.    [provider]  HYDROcodone -acetaminophen  (NORCO/VICODIN) 5-325 MG tablet 1-2 tabs po bid prn severe pain 06/23/19   Michal Agar, MD  HYDROcodone -acetaminophen  (NORCO/VICODIN) 5-325 MG tablet 1-2 tabs po bid prn 06/23/19   Michal Agar, MD  hydrOXYzine  (ATARAX /VISTARIL ) 25 MG tablet Take 25 mg by mouth every 6 (six) hours as needed.    [provider]  losartan  (COZAAR ) 25 MG tablet Take 25 mg by mouth daily.    [provider]  montelukast  (SINGULAIR ) 10 MG tablet Take 10 mg by mouth at bedtime.  [provider]  mupirocin  ointment (BACTROBAN ) 2 % Apply 1 application topically 3 (three) times daily. 03/05/18   Ethlyn Herd, MD  potassium chloride  (K-DUR,KLOR-CON ) 10 MEQ tablet Take 10 mEq by mouth 2 (two) times daily.    [provider]  pravastatin  (PRAVACHOL ) 10 MG tablet Take 10 mg by mouth daily.    [provider]  ranitidine (ZANTAC) 75 MG tablet Take 75 mg by mouth as needed for heartburn.    [provider]  torsemide (DEMADEX) 20 MG tablet Take 20 mg by mouth daily. 10/09/20   [provider]    Family History Family History  Problem  Relation Age of Onset   Liver cancer Mother    Colon cancer Father     Social History Social History   Tobacco Use   Smoking status: Never   Smokeless tobacco: Never  Vaping Use   Vaping status: Never Used  Substance Use Topics   Alcohol use: No   Drug use: No     Allergies   Alendronate   Review of Systems Review of Systems :negative unless otherwise stated in HPI.      Physical Exam Triage Vital Signs ED Triage Vitals  Encounter Vitals Group     BP 08/21/23 1039 (!) 116/59     Systolic BP Percentile --      Diastolic BP Percentile --      Pulse Rate 08/21/23 1034 67     Resp 08/21/23 1034 16     Temp 08/21/23 1034 (!) 97 F (36.1 C)     Temp Source 08/21/23 1034 Temporal     SpO2 08/21/23 1034 98 %     Weight --      Height --      Head Circumference --      Peak Flow --      Pain Score 08/21/23 1031 0     Pain Loc --      Pain Education --      Exclude from Growth Chart --    No data found.  Updated Vital Signs BP (!) 116/59 (BP Location: Right Arm)   Pulse 67   Temp (!) 97 F (36.1 C) (Temporal)   Resp 16   SpO2 98%   Visual Acuity Right Eye Distance:   Left Eye Distance:   Bilateral Distance:    Right Eye Near:   Left Eye Near:    Bilateral Near:     Physical Exam  GEN: alert, elderly female, in no acute distress  EYES: no scleral injection or discharge CV: regular rate  RESP: no increased work of breathing MSK: Purple discoloration of bilateral feet with bilateral edema which is not new NEURO: alert, moves all extremities appropriately SKIN: warm and dry; erythematous patches under breasts and anterior chest wall; multiple scaly hyperpigmented lesions on lower extremities and large wound that is healing by secondary intention on left lateral shoulder    UC Treatments / Results  Labs (all labs ordered are listed, but only abnormal results are displayed) Labs Reviewed - No data to display  EKG   Radiology No results  found.  Procedures Procedures (including critical care time)  Medications Ordered in UC Medications - No data to display  Initial Impression / Assessment and Plan / UC Course  I have reviewed the triage vital signs and the nursing notes.  Pertinent labs & imaging results that were available during my care of the patient were reviewed by me and considered in  my medical decision making (see chart for details).     Patient is a 89 y.o. femalewho presents for rash on anterior chest.  Overall, patient is well-appearing and well-hydrated.  Vital signs stable.  Theo is afebrile.  Etiology of her itchy red rash is undetermined.  We will treat broadly with antibiotics for possible cellulitis, Diflucan for possible Candida infection and topical steroid ointment.  Not likely viral exanthem.  She had her shingles vaccine and rash does not appear to be herpetic.  Reviewed expectations regarding course of current medical issues.  All questions asked were answered.  Outlined signs and symptoms indicating need for more acute intervention. Patient verbalized understanding. After Visit Summary given.   Final Clinical Impressions(s) / UC Diagnoses   Final diagnoses:  Rash     Discharge Instructions      The cause of your rash has not been identified but we will treat you for multiple causes.  Stop at the pharmacy and pick up your antibiotic called doxycycline, antifungal called Diflucan/econazole and a steroid ointment.  You can take 1 tablet of Benadryl  to help with itching as needed.    ED Prescriptions     Medication Sig Dispense Auth. Provider   fluconazole (DIFLUCAN) 150 MG tablet Take 1 tablet (150 mg total) by mouth every 3 (three) days for 2 doses. 2 tablet Dezmin Kittelson, DO   doxycycline (VIBRAMYCIN) 100 MG capsule Take 1 capsule (100 mg total) by mouth 2 (two) times daily. 14 capsule Will Schier, DO   triamcinolone  ointment (KENALOG ) 0.5 % Apply 1 Application topically 2  (two) times daily. 30 g Taeya Theall, DO      PDMP not reviewed this encounter.              Adrean Findlay, DO 08/21/23 1209

## 2023-08-21 NOTE — Discharge Instructions (Signed)
 The cause of your rash has not been identified but we will treat you for multiple causes.  Stop at the pharmacy and pick up your antibiotic called doxycycline, antifungal called Diflucan/econazole and a steroid ointment.  You can take 1 tablet of Benadryl  to help with itching as needed.

## 2023-08-22 ENCOUNTER — Ambulatory Visit: Payer: Self-pay

## 2024-04-27 ENCOUNTER — Ambulatory Visit: Admission: RE | Admit: 2024-04-27 | Source: Home / Self Care

## 2024-04-27 ENCOUNTER — Other Ambulatory Visit: Payer: Self-pay | Admitting: Family Medicine

## 2024-04-27 ENCOUNTER — Ambulatory Visit: Admission: RE | Admit: 2024-04-27 | Source: Ambulatory Visit

## 2024-04-27 DIAGNOSIS — S60221A Contusion of right hand, initial encounter: Secondary | ICD-10-CM
